# Patient Record
Sex: Male | Born: 1980 | Race: White | Hispanic: No | Smoking: Current every day smoker
Health system: Southern US, Community
[De-identification: ages and names within clinical notes are randomized; demographics above are authoritative.]

## PROBLEM LIST (undated history)

## (undated) DIAGNOSIS — F1010 Alcohol abuse, uncomplicated: Secondary | ICD-10-CM

## (undated) DIAGNOSIS — J45909 Unspecified asthma, uncomplicated: Secondary | ICD-10-CM

## (undated) DIAGNOSIS — M109 Gout, unspecified: Secondary | ICD-10-CM

## (undated) DIAGNOSIS — K589 Irritable bowel syndrome without diarrhea: Secondary | ICD-10-CM

## (undated) DIAGNOSIS — R251 Tremor, unspecified: Secondary | ICD-10-CM

---

## 2004-05-27 ENCOUNTER — Emergency Department: Payer: Self-pay | Admitting: Emergency Medicine

## 2004-10-16 ENCOUNTER — Ambulatory Visit: Payer: Self-pay | Admitting: Family Medicine

## 2004-11-05 ENCOUNTER — Ambulatory Visit: Payer: Self-pay | Admitting: Internal Medicine

## 2010-09-17 ENCOUNTER — Ambulatory Visit: Payer: Self-pay | Admitting: Family Medicine

## 2011-08-11 ENCOUNTER — Ambulatory Visit: Payer: Self-pay | Admitting: Family Medicine

## 2012-07-28 ENCOUNTER — Emergency Department: Payer: Self-pay | Admitting: Emergency Medicine

## 2012-07-28 LAB — COMPREHENSIVE METABOLIC PANEL
Alkaline Phosphatase: 58 U/L (ref 50–136)
Calcium, Total: 8.6 mg/dL (ref 8.5–10.1)
Chloride: 106 mmol/L (ref 98–107)
EGFR (African American): >60
EGFR (Non-African Amer.): >60
Potassium: 4 mmol/L (ref 3.5–5.1)
SGPT (ALT): 56 U/L (ref 12–78)
Sodium: 139 mmol/L (ref 136–145)
Total Protein: 7.7 g/dL (ref 6.4–8.2)

## 2012-07-28 LAB — URINALYSIS, COMPLETE
Bacteria: NONE SEEN
Bilirubin,UR: NEGATIVE
Blood: NEGATIVE
Glucose,UR: NEGATIVE mg/dL (ref 0–75)
Leukocyte Esterase: NEGATIVE
Nitrite: NEGATIVE
Protein: 30
RBC,UR: 2 /HPF (ref 0–5)
Specific Gravity: 1.028 (ref 1.003–1.030)
WBC UR: 1 /HPF (ref 0–5)

## 2012-07-28 LAB — ETHANOL
Ethanol %: <0.003 % (ref 0.000–0.080)
Ethanol: <3 mg/dL

## 2012-07-28 LAB — CBC WITH DIFFERENTIAL/PLATELET
Eosinophil %: 0.3 %
HCT: 46 % (ref 40.0–52.0)
HGB: 16.2 g/dL (ref 13.0–18.0)
Lymphocyte #: 0.7 10*3/uL — ABNORMAL LOW (ref 1.0–3.6)
Lymphocyte %: 6.6 %
MCH: 31.3 pg (ref 26.0–34.0)
MCHC: 35.2 g/dL (ref 32.0–36.0)
Monocyte #: 0.8 x10 3/mm (ref 0.2–1.0)
Monocyte %: 7.4 %
Neutrophil #: 9.5 10*3/uL — ABNORMAL HIGH (ref 1.4–6.5)
Platelet: 157 10*3/uL (ref 150–440)
RBC: 5.18 10*6/uL (ref 4.40–5.90)

## 2012-07-28 LAB — LIPASE, BLOOD: Lipase: 98 U/L (ref 73–393)

## 2012-08-23 ENCOUNTER — Ambulatory Visit: Payer: Self-pay | Admitting: Neurology

## 2016-10-22 ENCOUNTER — Emergency Department: Payer: Self-pay

## 2016-10-22 ENCOUNTER — Encounter: Payer: Self-pay | Admitting: Medical Oncology

## 2016-10-22 ENCOUNTER — Emergency Department
Admission: EM | Admit: 2016-10-22 | Discharge: 2016-10-22 | Disposition: A | Discharge status: Home / Self Care | Payer: Self-pay | Attending: Emergency Medicine | Admitting: Emergency Medicine

## 2016-10-22 DIAGNOSIS — R079 Chest pain, unspecified: Secondary | ICD-10-CM | POA: Insufficient documentation

## 2016-10-22 DIAGNOSIS — S5002XA Contusion of left elbow, initial encounter: Secondary | ICD-10-CM | POA: Insufficient documentation

## 2016-10-22 DIAGNOSIS — W102XXA Fall (on)(from) incline, initial encounter: Secondary | ICD-10-CM | POA: Insufficient documentation

## 2016-10-22 DIAGNOSIS — G250 Essential tremor: Secondary | ICD-10-CM | POA: Insufficient documentation

## 2016-10-22 DIAGNOSIS — Y9301 Activity, walking, marching and hiking: Secondary | ICD-10-CM | POA: Insufficient documentation

## 2016-10-22 DIAGNOSIS — S5011XA Contusion of right forearm, initial encounter: Secondary | ICD-10-CM | POA: Insufficient documentation

## 2016-10-22 DIAGNOSIS — S0083XA Contusion of other part of head, initial encounter: Secondary | ICD-10-CM | POA: Insufficient documentation

## 2016-10-22 DIAGNOSIS — R4020 Unspecified coma: Secondary | ICD-10-CM

## 2016-10-22 DIAGNOSIS — W19XXXA Unspecified fall, initial encounter: Secondary | ICD-10-CM

## 2016-10-22 DIAGNOSIS — J45909 Unspecified asthma, uncomplicated: Secondary | ICD-10-CM | POA: Insufficient documentation

## 2016-10-22 DIAGNOSIS — S301XXA Contusion of abdominal wall, initial encounter: Secondary | ICD-10-CM | POA: Insufficient documentation

## 2016-10-22 DIAGNOSIS — Y999 Unspecified external cause status: Secondary | ICD-10-CM | POA: Insufficient documentation

## 2016-10-22 DIAGNOSIS — S161XXA Strain of muscle, fascia and tendon at neck level, initial encounter: Secondary | ICD-10-CM | POA: Insufficient documentation

## 2016-10-22 DIAGNOSIS — S8002XA Contusion of left knee, initial encounter: Secondary | ICD-10-CM | POA: Insufficient documentation

## 2016-10-22 DIAGNOSIS — S39012A Strain of muscle, fascia and tendon of lower back, initial encounter: Secondary | ICD-10-CM | POA: Insufficient documentation

## 2016-10-22 DIAGNOSIS — S20212A Contusion of left front wall of thorax, initial encounter: Secondary | ICD-10-CM | POA: Insufficient documentation

## 2016-10-22 DIAGNOSIS — Y929 Unspecified place or not applicable: Secondary | ICD-10-CM | POA: Insufficient documentation

## 2016-10-22 DIAGNOSIS — S8001XA Contusion of right knee, initial encounter: Secondary | ICD-10-CM | POA: Insufficient documentation

## 2016-10-22 DIAGNOSIS — R55 Syncope and collapse: Secondary | ICD-10-CM | POA: Insufficient documentation

## 2016-10-22 HISTORY — DX: Unspecified asthma, uncomplicated: J45.909

## 2016-10-22 LAB — CBC WITH DIFFERENTIAL/PLATELET
Basophils Absolute: 0.1 10*3/uL (ref 0–0.1)
Basophils Relative: 2 %
Eosinophils Absolute: 0.1 10*3/uL (ref 0–0.7)
Eosinophils Relative: 2 %
HEMATOCRIT: 43.3 % (ref 40.0–52.0)
HEMOGLOBIN: 15.2 g/dL (ref 13.0–18.0)
LYMPHS ABS: 2.1 10*3/uL (ref 1.0–3.6)
Lymphocytes Relative: 35 %
MCH: 35 pg — ABNORMAL HIGH (ref 26.0–34.0)
MCHC: 35.1 g/dL (ref 32.0–36.0)
MCV: 99.9 fL (ref 80.0–100.0)
MONOS PCT: 9 %
Monocytes Absolute: 0.6 10*3/uL (ref 0.2–1.0)
NEUTROS ABS: 3.1 10*3/uL (ref 1.4–6.5)
NEUTROS PCT: 52 %
Platelets: 102 10*3/uL — ABNORMAL LOW (ref 150–440)
RBC: 4.34 MIL/uL — ABNORMAL LOW (ref 4.40–5.90)
RDW: 15.1 % — ABNORMAL HIGH (ref 11.5–14.5)
WBC: 6 10*3/uL (ref 3.8–10.6)

## 2016-10-22 LAB — COMPREHENSIVE METABOLIC PANEL
ALT: 33 U/L (ref 17–63)
AST: 96 U/L — ABNORMAL HIGH (ref 15–41)
Albumin: 4.8 g/dL (ref 3.5–5.0)
Alkaline Phosphatase: 45 U/L (ref 38–126)
Anion gap: 18 — ABNORMAL HIGH (ref 5–15)
BUN: 7 mg/dL (ref 6–20)
CHLORIDE: 101 mmol/L (ref 101–111)
CO2: 23 mmol/L (ref 22–32)
CREATININE: 0.7 mg/dL (ref 0.61–1.24)
Calcium: 8.8 mg/dL — ABNORMAL LOW (ref 8.9–10.3)
GFR calc non Af Amer: >60 mL/min (ref >60)
Glucose, Bld: 94 mg/dL (ref 65–99)
Potassium: 3.1 mmol/L — ABNORMAL LOW (ref 3.5–5.1)
SODIUM: 142 mmol/L (ref 135–145)
Total Bilirubin: 1 mg/dL (ref 0.3–1.2)
Total Protein: 7.7 g/dL (ref 6.5–8.1)

## 2016-10-22 MED ORDER — KETOROLAC TROMETHAMINE 30 MG/ML IJ SOLN
30.0000 mg | Freq: Once | INTRAMUSCULAR | Status: AC
  Administered 2016-10-22: 30 mg via INTRAVENOUS
  Filled 2016-10-22: qty 1

## 2016-10-22 MED ORDER — IOPAMIDOL (ISOVUE-300) INJECTION 61%
100.0000 mL | Freq: Once | INTRAVENOUS | Status: AC | PRN
  Administered 2016-10-22: 100 mL via INTRAVENOUS
  Filled 2016-10-22: qty 100

## 2016-10-22 MED ORDER — ORPHENADRINE CITRATE 30 MG/ML IJ SOLN
60.0000 mg | Freq: Once | INTRAMUSCULAR | Status: AC
  Administered 2016-10-22: 60 mg via INTRAMUSCULAR
  Filled 2016-10-22: qty 2

## 2016-10-22 MED ORDER — MELOXICAM 15 MG PO TABS: 15.0000 mg | ORAL_TABLET | Freq: Every day | ORAL | 0 refills | Status: DC

## 2016-10-22 MED ORDER — METHOCARBAMOL 500 MG PO TABS: 500.0000 mg | ORAL_TABLET | Freq: Four times a day (QID) | ORAL | 0 refills | Status: DC

## 2016-10-22 MED ORDER — SODIUM CHLORIDE 0.9 % IV BOLUS (SEPSIS)
1000.0000 mL | Freq: Once | INTRAVENOUS | Status: AC
  Administered 2016-10-22: 1000 mL via INTRAVENOUS

## 2016-10-22 NOTE — ED Notes (Signed)
c-collar applied  

## 2016-10-22 NOTE — ED Provider Notes (Signed)
[facility] Emergency Department Provider Note  ____________________________________________  Time seen: Approximately 5:46 PM  I have reviewed the triage vital signs and the nursing notes.   HISTORY  Chief Complaint Fall    HPI Paul Miles is a 36 y.o. male who presents to emergency department complaining of a fall down 11 stairs. He did strike his head and did lose consciousness. He is complaining of a headache, neck pain, chest pain, left-sided abdominal pain, left elbow pain, right forearm pain, bilateral knee pain. This occurred on the night before his presentation to the emergency department.Patient reports that he has essential tremors with radicular symptoms in his lower extremities. Patient reports however that he was trying to avoid his young son who is standing on the stairwell. Patient "missed a step" causing him to fall. Patient is unsure length of loss consciousness. Patient initially did not present as "it did not hurt that much." Patient reports that the pain has been increasing "all over." Patient has not had further lost consciousness. He denies any blurred vision, radicular symptoms and upper shortness. Patient does report headache, neck and back pain, chest pain with shortness of breath. Patient is endorsing abrasions to bilateral aspects of his lower abdominal wall with pain to the left side. Patient denies any hematuria or dysuria. He denies any melena.    Past Medical History:  Diagnosis Date  . Asthma     There are no active problems to display for this patient.   No past surgical history on file.  Prior to Admission medications   Medication Sig Start Date End Date Taking? Authorizing Provider  meloxicam (MOBIC) 15 MG tablet Take 1 tablet (15 mg total) by mouth daily. 10/22/16   Wynton Hufstetler, Delorise Royals, PA-C  methocarbamol (ROBAXIN) 500 MG tablet Take 1 tablet (500 mg total) by mouth 4 (four) times daily. 10/22/16   Layna Roeper, Delorise Royals,  PA-C    Allergies Patient has no known allergies.  No family history on file.  Social History Social History  Substance Use Topics  . Smoking status: Not on file  . Smokeless tobacco: Not on file  . Alcohol use Not on file     Review of Systems  Constitutional: No fever/chills Eyes: No visual changes. No discharge ENT: No upper respiratory complaints. Cardiovascular: no chest pain. Respiratory: no cough. Positive SOB. Gastrointestinal: No abdominal pain.  No nausea, no vomiting.  No diarrhea.  No constipation. Genitourinary: Negative for dysuria. No hematuria Musculoskeletal: Positive for neck pain, left-sided chest pain, lower back pain, right forearm pain, left elbow pain, bilateral knee pain. Skin: Negative for rash, abrasions, lacerations. Positive for multiple areas of contusions to the torso, extremities Neurological: Positive for headache but denies focal weakness or numbness. Positive for essential tremor 10-point ROS otherwise negative.  ____________________________________________   PHYSICAL EXAM:  VITAL SIGNS: ED Triage Vitals  Enc Vitals Group     BP 10/22/16 1723 (!) 133/98     Pulse Rate 10/22/16 1723 96     Resp 10/22/16 1723 17     Temp 10/22/16 1723 (!) 97.5 F (36.4 C)     Temp Source 10/22/16 1723 Oral     SpO2 10/22/16 1723 97 %     Weight 10/22/16 1716 190 lb (86.2 kg)     Height 10/22/16 1716 6' (1.829 m)     Head Circumference --      Peak Flow --      Pain Score 10/22/16 1716 8  Pain Loc --      Pain Edu? --      Excl. in GC? --      Constitutional: Alert and oriented. Well appearing and in no acute distress. Eyes: Conjunctivae are normal. PERRL. EOMI. Head: With mild ecchymosis noted to left-sided zygomatic arch. Patient is nontender to palpation of the osseous structures of the skull but is tender to palpation of the bilateral zygomatic arches. No palpable abnormality. No battle signs. No raccoon eyes. No serosanguineous fluid  drainage from the ears or nares. ENT:      Ears:       Nose: No congestion/rhinnorhea.      Mouth/Throat: Mucous membranes are moist.  Neck: No stridor.  Positive diffuse midline cervical spine tenderness to palpation. Patient is also tender to palpation over bilateral paraspinal muscle groups. Radial pulse intact bilateral upper extremity's. Sensation intact and equal bilateral upper extremities.  Cardiovascular: Normal rate, regular rhythm. Normal S1 and S2.  Good peripheral circulation. Respiratory: Normal respiratory effort without tachypnea or retractions. Lungs CTAB. Good air entry to the bases with no decreased or absent breath sounds. Gastrointestinal: Bowel sounds 4 quadrants. Abdomen soft palpation. Patient is tender to palpation left upper and left lower quadrants.. Patient is guarding left upper and left lower abdominal quadrants. No rigidity. No palpable masses. No distention. No CVA tenderness. Musculoskeletal: Patient has ecchymosis to the right forearm. Full range of motion to the elbow and right wrist. Patient is tender to palpation mid shaft. No palpable abnormality. Radial pulses intact) shimmy. Sensation intact all 5 digits right upper extremity. Patient has ecchymosis to the lateral and posterior left elbow. Full range of motion to the elbow. Patient is diffusely tender to the posterior and lateral aspect of the elbow. No palpable abnormality. Radial pulse and sensation intact distally. Ecchymosis is noted to bilateral tibial plateaus. No visible deformity or gross edema to the right knee. Full range of motion to the right knee. There is, valgus, Lachman's, McMurray's is negative. Patient is tender to palpation over the tibial plateau. No palpable abnormality. Dorsalis pedis pulse intact distally. Sensation intact distally. No visible deformity or gross edema to the left knee. The patient's tender to palpation of the tibial plateau. There is, valgus, Lachman's, McMurray's is negative.  Dorsalis pedis pulse intact distally. Sensation intact distally.. Neurologic:  Normal speech and language. No gross focal neurologic deficits are appreciated. Cranial nerves II through XII grossly intact. Skin:  Skin is warm, dry and intact. No rash noted. Psychiatric: Mood and affect are normal. Speech and behavior are normal. Patient exhibits appropriate insight and judgement.   ____________________________________________   LABS (all labs ordered are listed, but only abnormal results are displayed)  Labs Reviewed  COMPREHENSIVE METABOLIC PANEL - Abnormal; Notable for the following:       Result Value   Potassium 3.1 (*)    Calcium 8.8 (*)    AST 96 (*)    Anion gap 18 (*)    All other components within normal limits  CBC WITH DIFFERENTIAL/PLATELET - Abnormal; Notable for the following:    RBC 4.34 (*)    MCH 35.0 (*)    RDW 15.1 (*)    Platelets 102 (*)    All other components within normal limits   ____________________________________________  EKG  EKG reveals normal sinus rhythm at a rate of 88 bpm. PR and QRS interval within normal limits. Slightly prolonged QT interval. No ST elevations or depressions noted. No Q waves or delta waves identified.  Normal axis. ____________________________________________  RADIOLOGY Festus BarrenI, Pershing Skidmore D Markiyah Gahm, personally viewed and evaluated these images (plain radiographs) as part of my medical decision making, as well as reviewing the written report by the radiologist.  Dg Elbow Complete Left  Result Date: 10/22/2016 CLINICAL DATA:  Fall down stairs today. Diffuse elbow pain. Initial encounter. EXAM: LEFT ELBOW - COMPLETE 3+ VIEW COMPARISON:  None. FINDINGS: There is no evidence of fracture, dislocation, or joint effusion. There is no evidence of arthropathy or other focal bone abnormality. Soft tissues are unremarkable. IMPRESSION: Negative. Electronically Signed   By: Marnee SpringJonathon  Watts M.D.   On: 10/22/2016 19:03   Dg Forearm Right  Result  Date: 10/22/2016 CLINICAL DATA:  Pain following fall EXAM: RIGHT FOREARM - 2 VIEW COMPARISON:  None. FINDINGS: Frontal and lateral views were obtained. No fracture or dislocation. Joint spaces appear normal. No erosive change. IMPRESSION: No fracture or dislocation.  No evident arthropathy. Electronically Signed   By: Bretta BangWilliam  Woodruff III M.D.   On: 10/22/2016 19:03   Ct Head Wo Contrast  Result Date: 10/22/2016 CLINICAL DATA:  36 year old male with acute headache and neck pain following fall yesterday. Loss of consciousness. EXAM: CT HEAD WITHOUT CONTRAST CT CERVICAL SPINE WITHOUT CONTRAST TECHNIQUE: Multidetector CT imaging of the head and cervical spine was performed following the standard protocol without intravenous contrast. Multiplanar CT image reconstructions of the cervical spine were also generated. COMPARISON:  08/23/2012 brain MR FINDINGS: CT HEAD FINDINGS Brain: No evidence of acute infarction, hemorrhage, hydrocephalus, extra-axial collection or mass lesion/mass effect. Mild scattered white matter hypodensities are unchanged. Vascular: No hyperdense vessel or unexpected calcification. Skull: Normal. Negative for fracture or focal lesion. Sinuses/Orbits: No acute finding. Other: None. CT CERVICAL SPINE FINDINGS Alignment: Normal. Skull base and vertebrae: No acute fracture. No primary bone lesion or focal pathologic process. Soft tissues and spinal canal: No prevertebral fluid or swelling. No visible canal hematoma. Disc levels:  Unremarkable Upper chest: Negative. Other: None IMPRESSION: No evidence of acute intracranial abnormality. No static evidence of acute injury to the cervical spine. Electronically Signed   By: Harmon PierJeffrey  Hu M.D.   On: 10/22/2016 18:47   Ct Chest W Contrast  Result Date: 10/22/2016 CLINICAL DATA:  Fall down 11 stairs. Chest pain with shortness of breath. Left-sided abdominal pain. Bruising. EXAM: CT CHEST, ABDOMEN, AND PELVIS WITH CONTRAST TECHNIQUE: Multidetector CT imaging  of the chest, abdomen and pelvis was performed following the standard protocol during bolus administration of intravenous contrast. CONTRAST:  100mL ISOVUE-300 IOPAMIDOL (ISOVUE-300) INJECTION 61% COMPARISON:  Abdominal CT 07/28/2012 FINDINGS: CT CHEST FINDINGS Cardiovascular: No acute aortic injury. The heart is normal in size. No pericardial fluid. Mediastinum/Nodes: No mediastinal hematoma or fluid. No mediastinal or hilar adenopathy. Visualized thyroid gland is normal. No pneumomediastinum. The esophagus is decompressed. Lungs/Pleura: No pneumothorax or pulmonary contusion. Central bronchial thickening. Linear atelectasis in the right lower lobe. Minimal mucous in the left mainstem bronchus, with scattered adherent mucus in the upper trachea. No pleural fluid. Musculoskeletal: No fracture of the sternum, ribs, included clavicles and shoulder girdles or thoracic spine. No confluent body wall contusion. Mild scoliotic curvature of the midthoracic spine. CT ABDOMEN PELVIS FINDINGS Hepatobiliary: No hepatic injury or perihepatic hematoma. Severely decreased hepatic density consistent with steatosis, with more focal fatty infiltration adjacent with falciform ligament. Gallbladder is unremarkable Pancreas: No ductal dilatation or inflammation. Spleen: No splenic injury or perisplenic hematoma. Adrenals/Urinary Tract: No adrenal hemorrhage or renal injury identified. Homogeneous renal enhancement with symmetric excretion on delayed phase  imaging. Bladder is unremarkable. Stomach/Bowel: No evidence of bowel injury. Stomach is decompressed. No bowel wall thickening, obstruction or inflammation. Normal appendix. No mesenteric hematoma. Scattered colonic diverticula throughout the entire colon. Vascular/Lymphatic: No acute vascular injury. The abdominal aorta and IVC are intact. No retroperitoneal fluid. No abdominal or pelvic adenopathy. Reproductive: Prostate is unremarkable. Other: No free air, free fluid, or  intra-abdominal fluid collection. Tiny fat containing umbilical hernia. Musculoskeletal: No fracture of the lumbar spine or bony pelvis. No confluent body wall contusion. IMPRESSION: 1. No acute traumatic injury to the chest, abdomen, or pelvis. 2. Incidental findings in the abdomen include severe hepatic steatosis and colonic diverticulosis. 3. Incidental findings in the chest include bronchial thickening which may be due to history of asthma. Electronically Signed   By: Rubye Oaks M.D.   On: 10/22/2016 18:49   Ct Cervical Spine Wo Contrast  Result Date: 10/22/2016 CLINICAL DATA:  36 year old male with acute headache and neck pain following fall yesterday. Loss of consciousness. EXAM: CT HEAD WITHOUT CONTRAST CT CERVICAL SPINE WITHOUT CONTRAST TECHNIQUE: Multidetector CT imaging of the head and cervical spine was performed following the standard protocol without intravenous contrast. Multiplanar CT image reconstructions of the cervical spine were also generated. COMPARISON:  08/23/2012 brain MR FINDINGS: CT HEAD FINDINGS Brain: No evidence of acute infarction, hemorrhage, hydrocephalus, extra-axial collection or mass lesion/mass effect. Mild scattered white matter hypodensities are unchanged. Vascular: No hyperdense vessel or unexpected calcification. Skull: Normal. Negative for fracture or focal lesion. Sinuses/Orbits: No acute finding. Other: None. CT CERVICAL SPINE FINDINGS Alignment: Normal. Skull base and vertebrae: No acute fracture. No primary bone lesion or focal pathologic process. Soft tissues and spinal canal: No prevertebral fluid or swelling. No visible canal hematoma. Disc levels:  Unremarkable Upper chest: Negative. Other: None IMPRESSION: No evidence of acute intracranial abnormality. No static evidence of acute injury to the cervical spine. Electronically Signed   By: Harmon Pier M.D.   On: 10/22/2016 18:47   Ct Abdomen Pelvis W Contrast  Result Date: 10/22/2016 CLINICAL DATA:  Fall  down 11 stairs. Chest pain with shortness of breath. Left-sided abdominal pain. Bruising. EXAM: CT CHEST, ABDOMEN, AND PELVIS WITH CONTRAST TECHNIQUE: Multidetector CT imaging of the chest, abdomen and pelvis was performed following the standard protocol during bolus administration of intravenous contrast. CONTRAST:  ISOVUE-300 IOPAMIDOL (ISOVUE-300) INJECTION 61% COMPARISON:  Abdominal CT 07/28/2012 FINDINGS: CT CHEST FINDINGS Cardiovascular: No acute aortic injury. The heart is normal in size. No pericardial fluid. Mediastinum/Nodes: No mediastinal hematoma or fluid. No mediastinal or hilar adenopathy. Visualized thyroid gland is normal. No pneumomediastinum. The esophagus is decompressed. Lungs/Pleura: No pneumothorax or pulmonary contusion. Central bronchial thickening. Linear atelectasis in the right lower lobe. Minimal mucous in the left mainstem bronchus, with scattered adherent mucus in the upper trachea. No pleural fluid. Musculoskeletal: No fracture of the sternum, ribs, included clavicles and shoulder girdles or thoracic spine. No confluent body wall contusion. Mild scoliotic curvature of the midthoracic spine. CT ABDOMEN PELVIS FINDINGS Hepatobiliary: No hepatic injury or perihepatic hematoma. Severely decreased hepatic density consistent with steatosis, with more focal fatty infiltration adjacent with falciform ligament. Gallbladder is unremarkable Pancreas: No ductal dilatation or inflammation. Spleen: No splenic injury or perisplenic hematoma. Adrenals/Urinary Tract: No adrenal hemorrhage or renal injury identified. Homogeneous renal enhancement with symmetric excretion on delayed phase imaging. Bladder is unremarkable. Stomach/Bowel: No evidence of bowel injury. Stomach is decompressed. No bowel wall thickening, obstruction or inflammation. Normal appendix. No mesenteric hematoma. Scattered colonic diverticula throughout  the entire colon. Vascular/Lymphatic: No acute vascular injury. The  abdominal aorta and IVC are intact. No retroperitoneal fluid. No abdominal or pelvic adenopathy. Reproductive: Prostate is unremarkable. Other: No free air, free fluid, or intra-abdominal fluid collection. Tiny fat containing umbilical hernia. Musculoskeletal: No fracture of the lumbar spine or bony pelvis. No confluent body wall contusion. IMPRESSION: 1. No acute traumatic injury to the chest, abdomen, or pelvis. 2. Incidental findings in the abdomen include severe hepatic steatosis and colonic diverticulosis. 3. Incidental findings in the chest include bronchial thickening which may be due to history of asthma. Electronically Signed   By: Rubye Oaks M.D.   On: 10/22/2016 18:49   Dg Knee Complete 4 Views Left  Result Date: 10/22/2016 CLINICAL DATA:  Pain following fall EXAM: LEFT KNEE - COMPLETE 4+ VIEW COMPARISON:  None. FINDINGS: Frontal, lateral, and bilateral oblique views were obtained. No fracture or dislocation. No joint effusion. Joint spaces appear normal. No erosive change. IMPRESSION: No fracture or joint effusion.  No appreciable arthropathy. Electronically Signed   By: Bretta Bang III M.D.   On: 10/22/2016 19:04   Dg Knee Complete 4 Views Right  Result Date: 10/22/2016 CLINICAL DATA:  Pain following fall EXAM: RIGHT KNEE - COMPLETE 4+ VIEW COMPARISON:  None. FINDINGS: Frontal, lateral, and bilateral oblique views were obtained. No fracture or dislocation. No joint effusion. Joint spaces appear normal. No erosive change. IMPRESSION: Fracture or joint effusion.  No evident arthropathy. Electronically Signed   By: Bretta Bang III M.D.   On: 10/22/2016 19:05    ____________________________________________    PROCEDURES  Procedure(s) performed:    Procedures    Medications  ketorolac (TORADOL) 30 MG/ML injection 30 mg (not administered)  orphenadrine (NORFLEX) injection 60 mg (not administered)  sodium chloride 0.9 % bolus 1,000 mL (0 mLs Intravenous Stopped 10/22/16  1949)  iopamidol (ISOVUE-300) 61 % injection 100 mL (100 mLs Intravenous Contrast Given 10/22/16 1815)     ____________________________________________   INITIAL IMPRESSION / ASSESSMENT AND PLAN / ED COURSE  Pertinent labs & imaging results that were available during my care of the patient were reviewed by me and considered in my medical decision making (see chart for details).  Review of the [place] CSRS was performed in accordance of the [facility] prior to dispensing any controlled drugs.     Patient's diagnosis is consistent with A fall resulting in loss of consciousness, contusion of the face, strain of neck muscle and lumbar region, contusion of the left chest wall, contusion of the left abdominal wall, forearm contusion, elbow contusion, bilateral knee contusions. Patient suffered a fall down 11 steps with loss of consciousness. This occurred yesterday but patient reports that symptoms were worsening today. No acute consciousness. Patient was evaluated with CT scans of the head, neck, chest abdomen and pelvis. These returned with reassuring results with no acute abnormalities. patient's x-rays of the right forearm, left elbow, bilateral knees reveal no acute osseous abnormality. Labs are reassuring. No significant EKG changes. Exam was grossly reassuring. With reassuring radiological results, patient will be given symptom control medications and discharged home.. Patient will be discharged home with prescriptions for anti-inflammatories and muscle relaxer. Patient is to follow up with primary care as needed or otherwise directed. Patient is given ED precautions to return to the ED for any worsening or new symptoms.     ____________________________________________  FINAL CLINICAL IMPRESSION(S) / ED DIAGNOSES  Final diagnoses:  Fall, initial encounter  Contusion of face, initial encounter  Strain of  neck muscle, initial encounter  Strain of lumbar region, initial encounter  Contusion of left  chest wall, initial encounter  Contusion of abdominal wall, initial encounter  Contusion of left elbow, initial encounter  Contusion of right forearm, initial encounter  Contusion of left knee, initial encounter  Contusion of right knee, initial encounter  Essential tremor  Loss of consciousness (HCC)      NEW MEDICATIONS STARTED DURING THIS VISIT:  New Prescriptions   MELOXICAM (MOBIC) 15 MG TABLET    Take 1 tablet (15 mg total) by mouth daily.   METHOCARBAMOL (ROBAXIN) 500 MG TABLET    Take 1 tablet (500 mg total) by mouth 4 (four) times daily.        This chart was dictated using voice recognition software/Dragon. Despite best efforts to proofread, errors can occur which can change the meaning. Any change was purely unintentional.    Racheal Patches, PA-C 10/22/16 1954    Zyion Doxtater, Delorise Royals, PA-C 10/22/16 1958    Emily Filbert, MD 10/22/16 2127

## 2016-10-22 NOTE — ED Triage Notes (Signed)
Pt reports he missed a step yesterday and fell, pt c/o pain to rt side. Denies head injury.

## 2016-10-22 NOTE — ED Notes (Signed)
Pt reports yesterday fell down 11 stairs yesterday with positive LOC.  Tenderness to cervical spine on palpation.  Also c/o abdominal tenderness; most pain to LLQ on exam.  Bruising to abdomen as well as arms and legs.  Pt also c/o severe chest pain.

## 2016-10-22 NOTE — ED Notes (Signed)
At bedside to check vitals. Pt remains in radiology

## 2016-10-22 NOTE — ED Notes (Signed)
Patient transported to CT 

## 2017-03-04 ENCOUNTER — Emergency Department: Payer: Self-pay

## 2017-03-04 ENCOUNTER — Emergency Department
Admission: EM | Admit: 2017-03-04 | Discharge: 2017-03-04 | Disposition: A | Discharge status: Home / Self Care | Payer: Self-pay | Attending: Emergency Medicine | Admitting: Emergency Medicine

## 2017-03-04 DIAGNOSIS — Z791 Long term (current) use of non-steroidal anti-inflammatories (NSAID): Secondary | ICD-10-CM | POA: Insufficient documentation

## 2017-03-04 DIAGNOSIS — J209 Acute bronchitis, unspecified: Secondary | ICD-10-CM | POA: Insufficient documentation

## 2017-03-04 DIAGNOSIS — F17200 Nicotine dependence, unspecified, uncomplicated: Secondary | ICD-10-CM | POA: Insufficient documentation

## 2017-03-04 HISTORY — DX: Gout, unspecified: M10.9

## 2017-03-04 HISTORY — DX: Tremor, unspecified: R25.1

## 2017-03-04 HISTORY — DX: Irritable bowel syndrome, unspecified: K58.9

## 2017-03-04 MED ORDER — AZITHROMYCIN 250 MG PO TABS: ORAL_TABLET | ORAL | 0 refills | Status: AC

## 2017-03-04 MED ORDER — PREDNISONE 50 MG PO TABS: ORAL_TABLET | ORAL | 0 refills | Status: DC

## 2017-03-04 NOTE — ED Notes (Signed)
Patient transported to X-ray 

## 2017-03-04 NOTE — ED Triage Notes (Signed)
Patient c/o productive cough, fever X 3-4 days. Patient's significant other reports patient was wheezing yesterday. Patient denies any current SOB. Patient had temperature of 104 at home. Patient took ibuprofen at approx 2 hours ago. Patient afebrile in triage.

## 2017-03-04 NOTE — ED Provider Notes (Signed)
[facility] Emergency Department Provider Note  ____________________________________________  Time seen: Approximately 9:14 PM  I have reviewed the triage vital signs and the nursing notes.   HISTORY  Chief Complaint Cough and Fever    HPI Paul RossJohn B Chamberland is a 36 y.o. male presents to the emergency department with productive cough for green sputum production, shortness of breath and congestion for the past 5 days.  Fever has been as high as 103 F assessed orally.  Patient denies chest pain, chest tightness, nausea, vomiting abdominal pain.  Patient has a history of asthma and has noticed wheezing.  No alleviating measures have been attempted aside from albuterol inhaler.   Past Medical History:  Diagnosis Date  . Asthma   . Gout   . IBS (irritable bowel syndrome)   . Tremor     There are no active problems to display for this patient.   History reviewed. No pertinent surgical history.  Prior to Admission medications   Medication Sig Start Date End Date Taking? Authorizing Provider  azithromycin (ZITHROMAX Z-PAK) 250 MG tablet Take 2 tablets (500 mg) on  Day 1,  followed by 1 tablet (250 mg) once daily on Days 2 through 5. 03/04/17 03/09/17  Orvil FeilWoods, Blythe Veach M, PA-C  meloxicam (MOBIC) 15 MG tablet Take 1 tablet (15 mg total) by mouth daily. 10/22/16   Cuthriell, Delorise RoyalsJonathan D, PA-C  methocarbamol (ROBAXIN) 500 MG tablet Take 1 tablet (500 mg total) by mouth 4 (four) times daily. 10/22/16   Cuthriell, Delorise RoyalsJonathan D, PA-C  predniSONE (DELTASONE) 50 MG tablet Take one 50 mg tablet once a day for five days. 03/04/17   Orvil FeilWoods, Charlisha Market M, PA-C    Allergies Patient has no known allergies.  No family history on file.  Social History Social History   Tobacco Use  . Smoking status: Current Every Day Smoker  . Smokeless tobacco: Never Used  Substance Use Topics  . Alcohol use: Yes  . Drug use: No     Review of Systems  Constitutional: Patient has had  fever. Eyes: No visual changes. No discharge ENT: Patient has had congestion. Cardiovascular: no chest pain. Respiratory: Patient has cough, shortness of breath and wheezing. Gastrointestinal: No abdominal pain.  No nausea, no vomiting.  No diarrhea.  No constipation. Musculoskeletal: Negative for musculoskeletal pain. Skin: Negative for rash, abrasions, lacerations, ecchymosis. Neurological: Negative for headaches, focal weakness or numbness.  ____________________________________________   PHYSICAL EXAM:  VITAL SIGNS: ED Triage Vitals  Enc Vitals Group     BP 03/04/17 2001 128/81     Pulse Rate 03/04/17 2001 87     Resp 03/04/17 2001 17     Temp 03/04/17 2001 97.7 F (36.5 C)     Temp Source 03/04/17 2001 Oral     SpO2 03/04/17 2001 98 %     Weight 03/04/17 2002 190 lb (86.2 kg)     Height --      Head Circumference --      Peak Flow --      Pain Score 03/04/17 2001 4     Pain Loc --      Pain Edu? --      Excl. in GC? --      Constitutional: Alert and oriented. Well appearing and in no acute distress. Eyes: Conjunctivae are normal. PERRL. EOMI. Head: Atraumatic. ENT:      Ears: TMs are injected bilaterally.      Nose: No congestion/rhinnorhea.      Mouth/Throat: Mucous membranes  are moist.  Posterior pharynx is mildly erythematous.  Uvula is midline. Hematological/Lymphatic/Immunilogical: No cervical lymphadenopathy. Cardiovascular: Normal rate, regular rhythm. Normal S1 and S2.  Good peripheral circulation. Respiratory: Normal respiratory effort without tachypnea or retractions. Lungs CTAB. Good air entry to the bases with no decreased or absent breath sounds. Musculoskeletal: Full range of motion to all extremities. No gross deformities appreciated. Neurologic:  Normal speech and language. No gross focal neurologic deficits are appreciated.  Skin:  Skin is warm, dry and intact. No rash noted. Psychiatric: Mood and affect are normal. Speech and behavior are normal.  Patient exhibits appropriate insight and judgement.   ____________________________________________   LABS (all labs ordered are listed, but only abnormal results are displayed)  Labs Reviewed - No data to display ____________________________________________  EKG   ____________________________________________  RADIOLOGY Geraldo PitterI, Sterling Ucci M Livy Miles, personally viewed and evaluated these images (plain radiographs) as part of my medical decision making, as well as reviewing the written report by the radiologist.  Dg Chest 2 View  Result Date: 03/04/2017 CLINICAL DATA:  Cough and fever EXAM: CHEST  2 VIEW COMPARISON:  CT chest 10/22/2016 FINDINGS: Mild bronchitic changes. No consolidation or effusion. Normal heart size. No pneumothorax. IMPRESSION: Mild bronchitic changes.  No focal pulmonary infiltrate. Electronically Signed   By: Jasmine PangKim  Fujinaga M.D.   On: 03/04/2017 20:28    ____________________________________________    PROCEDURES  Procedure(s) performed:    Procedures    Medications - No data to display   ____________________________________________   INITIAL IMPRESSION / ASSESSMENT AND PLAN / ED COURSE  Pertinent labs & imaging results that were available during my care of the patient were reviewed by me and considered in my medical decision making (see chart for details).  Review of the [place] CSRS was performed in accordance of the [facility] prior to dispensing any controlled drugs.     Assessment and plan Acute bronchitis Differential diagnosis includes community-acquired pneumonia versus bronchitis versus asthma exacerbation.  Patient presents to the emergency department with productive cough, shortness of breath and perceived wheezing.  History and physical exam findings are consistent with acute bronchitis.  Chest x-ray reveals no consolidations or findings consistent with pneumonia.  Patient was discharged with azithromycin and prednisone.  Strict return precautions were  given to return to the emergency department for new or worsening symptoms.  Vital signs were reassuring prior to discharge.  All patient questions were answered.    ____________________________________________  FINAL CLINICAL IMPRESSION(S) / ED DIAGNOSES  Final diagnoses:  Acute bronchitis, unspecified organism      NEW MEDICATIONS STARTED DURING THIS VISIT:  ED Discharge Orders        Ordered    azithromycin (ZITHROMAX Z-PAK) 250 MG tablet     03/04/17 2112    predniSONE (DELTASONE) 50 MG tablet     03/04/17 2112          This chart was dictated using voice recognition software/Dragon. Despite best efforts to proofread, errors can occur which can change the meaning. Any change was purely unintentional.    Orvil FeilWoods, Jeanene Mena M, PA-C 03/04/17 2117    Arnaldo NatalMalinda, Paul F, MD 03/04/17 [phone]

## 2017-03-04 NOTE — ED Notes (Signed)
Pt states that he has had fever up to 104 at home and cough with dark green sputum since Tuesday. Pt also c/o chills and body aches. Pt in NAD at this time.

## 2017-04-06 ENCOUNTER — Emergency Department: Payer: Self-pay

## 2017-04-06 ENCOUNTER — Inpatient Hospital Stay
Admission: EM | Admit: 2017-04-06 | Discharge: 2017-04-08 | DRG: 439 | Disposition: A | Discharge status: Home / Self Care | Payer: Self-pay | Attending: Internal Medicine | Admitting: Internal Medicine

## 2017-04-06 ENCOUNTER — Encounter: Payer: Self-pay | Admitting: *Deleted

## 2017-04-06 ENCOUNTER — Other Ambulatory Visit: Payer: Self-pay

## 2017-04-06 ENCOUNTER — Inpatient Hospital Stay: Payer: Self-pay

## 2017-04-06 DIAGNOSIS — K219 Gastro-esophageal reflux disease without esophagitis: Secondary | ICD-10-CM | POA: Diagnosis present

## 2017-04-06 DIAGNOSIS — M109 Gout, unspecified: Secondary | ICD-10-CM | POA: Diagnosis present

## 2017-04-06 DIAGNOSIS — E8729 Other acidosis: Secondary | ICD-10-CM

## 2017-04-06 DIAGNOSIS — N289 Disorder of kidney and ureter, unspecified: Secondary | ICD-10-CM

## 2017-04-06 DIAGNOSIS — K529 Noninfective gastroenteritis and colitis, unspecified: Secondary | ICD-10-CM | POA: Diagnosis present

## 2017-04-06 DIAGNOSIS — E876 Hypokalemia: Secondary | ICD-10-CM | POA: Diagnosis present

## 2017-04-06 DIAGNOSIS — F1010 Alcohol abuse, uncomplicated: Secondary | ICD-10-CM | POA: Diagnosis present

## 2017-04-06 DIAGNOSIS — Z7141 Alcohol abuse counseling and surveillance of alcoholic: Secondary | ICD-10-CM

## 2017-04-06 DIAGNOSIS — F17200 Nicotine dependence, unspecified, uncomplicated: Secondary | ICD-10-CM | POA: Diagnosis present

## 2017-04-06 DIAGNOSIS — Z79899 Other long term (current) drug therapy: Secondary | ICD-10-CM

## 2017-04-06 DIAGNOSIS — E872 Acidosis: Secondary | ICD-10-CM | POA: Diagnosis present

## 2017-04-06 DIAGNOSIS — N179 Acute kidney failure, unspecified: Secondary | ICD-10-CM | POA: Diagnosis present

## 2017-04-06 DIAGNOSIS — K760 Fatty (change of) liver, not elsewhere classified: Secondary | ICD-10-CM | POA: Diagnosis present

## 2017-04-06 DIAGNOSIS — R112 Nausea with vomiting, unspecified: Secondary | ICD-10-CM

## 2017-04-06 DIAGNOSIS — K852 Alcohol induced acute pancreatitis without necrosis or infection: Principal | ICD-10-CM | POA: Diagnosis present

## 2017-04-06 LAB — COMPREHENSIVE METABOLIC PANEL
ALT: 86 U/L — ABNORMAL HIGH (ref 17–63)
AST: 155 U/L — ABNORMAL HIGH (ref 15–41)
Albumin: 5.7 g/dL — ABNORMAL HIGH (ref 3.5–5.0)
Alkaline Phosphatase: 75 U/L (ref 38–126)
Anion gap: 34 — ABNORMAL HIGH (ref 5–15)
BUN: 15 mg/dL (ref 6–20)
CHLORIDE: 88 mmol/L — AB (ref 101–111)
CO2: 7 mmol/L — ABNORMAL LOW (ref 22–32)
Calcium: 8.7 mg/dL — ABNORMAL LOW (ref 8.9–10.3)
Creatinine, Ser: 1.74 mg/dL — ABNORMAL HIGH (ref 0.61–1.24)
GFR calc non Af Amer: 49 mL/min — ABNORMAL LOW (ref >60)
GFR, EST AFRICAN AMERICAN: 57 mL/min — AB (ref >60)
Glucose, Bld: 179 mg/dL — ABNORMAL HIGH (ref 65–99)
POTASSIUM: 5.8 mmol/L — AB (ref 3.5–5.1)
Sodium: 129 mmol/L — ABNORMAL LOW (ref 135–145)
Total Bilirubin: 1.8 mg/dL — ABNORMAL HIGH (ref 0.3–1.2)
Total Protein: 9.2 g/dL — ABNORMAL HIGH (ref 6.5–8.1)

## 2017-04-06 LAB — CBC WITH DIFFERENTIAL/PLATELET
BASOS PCT: 0 %
Basophils Absolute: 0 10*3/uL (ref 0–0.1)
EOS ABS: 0 10*3/uL (ref 0–0.7)
EOS PCT: 0 %
HCT: 52 % (ref 40.0–52.0)
HEMOGLOBIN: 16.7 g/dL (ref 13.0–18.0)
LYMPHS ABS: 0.4 10*3/uL — AB (ref 1.0–3.6)
Lymphocytes Relative: 4 %
MCH: 33.4 pg (ref 26.0–34.0)
MCHC: 32.1 g/dL (ref 32.0–36.0)
MCV: 104 fL — ABNORMAL HIGH (ref 80.0–100.0)
MONOS PCT: 13 %
Monocytes Absolute: 1.5 10*3/uL — ABNORMAL HIGH (ref 0.2–1.0)
NEUTROS PCT: 83 %
Neutro Abs: 9.4 10*3/uL — ABNORMAL HIGH (ref 1.4–6.5)
PLATELETS: 169 10*3/uL (ref 150–440)
RBC: 5 MIL/uL (ref 4.40–5.90)
RDW: 14.6 % — AB (ref 11.5–14.5)
WBC: 11.4 10*3/uL — ABNORMAL HIGH (ref 3.8–10.6)

## 2017-04-06 LAB — INFLUENZA PANEL BY PCR (TYPE A & B)
Influenza A By PCR: NEGATIVE
Influenza B By PCR: NEGATIVE

## 2017-04-06 LAB — BASIC METABOLIC PANEL
ANION GAP: 9 (ref 5–15)
BUN: 14 mg/dL (ref 6–20)
CHLORIDE: 97 mmol/L — AB (ref 101–111)
CO2: 19 mmol/L — AB (ref 22–32)
Calcium: 7.3 mg/dL — ABNORMAL LOW (ref 8.9–10.3)
Creatinine, Ser: 1.11 mg/dL (ref 0.61–1.24)
GFR calc Af Amer: >60 mL/min (ref >60)
GLUCOSE: 165 mg/dL — AB (ref 65–99)
POTASSIUM: 3.7 mmol/L (ref 3.5–5.1)
Sodium: 125 mmol/L — ABNORMAL LOW (ref 135–145)

## 2017-04-06 LAB — PROTIME-INR
INR: 0.99
PROTHROMBIN TIME: 13 s (ref 11.4–15.2)

## 2017-04-06 LAB — APTT: aPTT: 28 seconds (ref 24–36)

## 2017-04-06 LAB — ETHANOL

## 2017-04-06 LAB — LIPASE, BLOOD: Lipase: 80 U/L — ABNORMAL HIGH (ref 11–51)

## 2017-04-06 MED ORDER — SODIUM CHLORIDE 0.9 % IV SOLN: INTRAVENOUS | Status: DC

## 2017-04-06 MED ORDER — DEXTROSE 5 % IV SOLN
INTRAVENOUS | Status: DC
  Administered 2017-04-06 – 2017-04-07 (×3): via INTRAVENOUS
  Filled 2017-04-06 (×3): qty 150

## 2017-04-06 MED ORDER — ONDANSETRON HCL 4 MG/2ML IJ SOLN
4.0000 mg | Freq: Once | INTRAMUSCULAR | Status: AC
  Administered 2017-04-06: 4 mg via INTRAVENOUS
  Filled 2017-04-06: qty 2

## 2017-04-06 MED ORDER — MORPHINE SULFATE (PF) 4 MG/ML IV SOLN
4.0000 mg | Freq: Once | INTRAVENOUS | Status: AC
  Administered 2017-04-06: 4 mg via INTRAVENOUS
  Filled 2017-04-06: qty 1

## 2017-04-06 MED ORDER — NICOTINE 21 MG/24HR TD PT24
21.0000 mg | MEDICATED_PATCH | Freq: Every day | TRANSDERMAL | Status: DC
  Administered 2017-04-06 – 2017-04-08 (×3): 21 mg via TRANSDERMAL
  Filled 2017-04-06 (×3): qty 1

## 2017-04-06 MED ORDER — IOPAMIDOL (ISOVUE-300) INJECTION 61%
100.0000 mL | Freq: Once | INTRAVENOUS | Status: AC | PRN
  Administered 2017-04-06: 100 mL via INTRAVENOUS

## 2017-04-06 MED ORDER — HYDROMORPHONE HCL 1 MG/ML IJ SOLN
2.0000 mg | INTRAMUSCULAR | Status: AC
  Administered 2017-04-06: 2 mg via INTRAVENOUS
  Filled 2017-04-06: qty 2

## 2017-04-06 MED ORDER — PANTOPRAZOLE SODIUM 40 MG IV SOLR
40.0000 mg | Freq: Once | INTRAVENOUS | Status: AC
  Administered 2017-04-06: 40 mg via INTRAVENOUS
  Filled 2017-04-06: qty 40

## 2017-04-06 MED ORDER — ONDANSETRON HCL 4 MG/2ML IJ SOLN
4.0000 mg | Freq: Four times a day (QID) | INTRAMUSCULAR | Status: DC
  Administered 2017-04-06 – 2017-04-07 (×5): 4 mg via INTRAVENOUS
  Filled 2017-04-06 (×5): qty 2

## 2017-04-06 MED ORDER — ALBUTEROL SULFATE (2.5 MG/3ML) 0.083% IN NEBU: 2.5000 mg | INHALATION_SOLUTION | Freq: Four times a day (QID) | RESPIRATORY_TRACT | Status: DC | PRN

## 2017-04-06 MED ORDER — PROMETHAZINE HCL 25 MG/ML IJ SOLN
25.0000 mg | Freq: Four times a day (QID) | INTRAMUSCULAR | Status: DC | PRN
  Administered 2017-04-07: 25 mg via INTRAVENOUS
  Filled 2017-04-06: qty 1

## 2017-04-06 MED ORDER — METOCLOPRAMIDE HCL 5 MG/ML IJ SOLN
10.0000 mg | Freq: Once | INTRAMUSCULAR | Status: AC
  Administered 2017-04-06: 10 mg via INTRAVENOUS
  Filled 2017-04-06: qty 2

## 2017-04-06 MED ORDER — SODIUM CHLORIDE 0.9 % IV BOLUS (SEPSIS)
2000.0000 mL | Freq: Once | INTRAVENOUS | Status: AC
  Administered 2017-04-06: 2000 mL via INTRAVENOUS

## 2017-04-06 MED ORDER — DEXTROSE 5 % AND 0.9 % NACL IV BOLUS
1000.0000 mL | Freq: Once | INTRAVENOUS | Status: DC
Start: 2017-04-06 — End: 2017-04-06

## 2017-04-06 MED ORDER — OXYCODONE HCL 5 MG PO TABS
5.0000 mg | ORAL_TABLET | ORAL | Status: DC | PRN
  Administered 2017-04-07: 5 mg via ORAL
  Filled 2017-04-06: qty 1

## 2017-04-06 MED ORDER — ACETAMINOPHEN 650 MG RE SUPP
650.0000 mg | Freq: Four times a day (QID) | RECTAL | Status: DC | PRN
Start: 2017-04-06 — End: 2017-04-08

## 2017-04-06 MED ORDER — IOPAMIDOL (ISOVUE-300) INJECTION 61%
15.0000 mL | INTRAVENOUS | Status: AC
  Administered 2017-04-06 (×2): 15 mL via ORAL

## 2017-04-06 MED ORDER — PANTOPRAZOLE SODIUM 40 MG IV SOLR
40.0000 mg | Freq: Two times a day (BID) | INTRAVENOUS | Status: DC
  Administered 2017-04-06 – 2017-04-07 (×3): 40 mg via INTRAVENOUS
  Filled 2017-04-06 (×4): qty 40

## 2017-04-06 MED ORDER — DEXTROSE-NACL 5-0.45 % IV SOLN
INTRAVENOUS | Status: DC
  Administered 2017-04-06: 15:00:00 via INTRAVENOUS

## 2017-04-06 MED ORDER — PROMETHAZINE HCL 25 MG/ML IJ SOLN
12.5000 mg | INTRAMUSCULAR | Status: AC
  Administered 2017-04-06: 12.5 mg via INTRAVENOUS
  Filled 2017-04-06: qty 1

## 2017-04-06 MED ORDER — MORPHINE SULFATE (PF) 2 MG/ML IV SOLN
2.0000 mg | INTRAVENOUS | Status: DC | PRN
  Administered 2017-04-07 (×2): 2 mg via INTRAVENOUS
  Filled 2017-04-06 (×2): qty 1

## 2017-04-06 MED ORDER — ENOXAPARIN SODIUM 40 MG/0.4ML ~~LOC~~ SOLN
40.0000 mg | SUBCUTANEOUS | Status: DC
  Filled 2017-04-06: qty 0.4

## 2017-04-06 MED ORDER — ACETAMINOPHEN 325 MG PO TABS: 650.0000 mg | ORAL_TABLET | Freq: Four times a day (QID) | ORAL | Status: DC | PRN

## 2017-04-06 NOTE — ED Triage Notes (Addendum)
Pt to ED reporting excessive vomiting  For the past days and reports "over 100 episodes"  abd pain and generalized aches. No diarrhea or fevers reported. PT is shaking in triage and reports he has been dizzy and lightheaded.   Pt has not been able to keep food or water down in two days. Pt reports second to last episode of vomiting was coffee ground in nature and last emesis was bright red blood.

## 2017-04-06 NOTE — ED Notes (Signed)
Pt states " I am seeing dots like fireworks, and when I walk outside I cant see anything"

## 2017-04-06 NOTE — H&P (Signed)
[facility] [place] [facility]   PATIENT NAME: Paul Miles    MR#:  [id number]  DATE OF BIRTH:  05-Dec-1980  DATE OF ADMISSION:  04/06/2017  PRIMARY CARE PHYSICIAN: Jeannie DonePolanco, Leonard F, MD   REQUESTING/REFERRING PHYSICIAN: Dr. Sharman CheekPhillip Stafford  CHIEF COMPLAINT:   Chief Complaint  Patient presents with  . Emesis    HISTORY OF PRESENT ILLNESS:  Paul ApaJohn Fuster  is a 36 y.o. male with a known history of asthma, IBS presents to hospital secondary to worsening abdominal pain, nausea and vomiting. Symptoms started about 2 days ago with myalgias, chills at home. Denies any diarrhea, actually constipated. No fevers here. Significant nausea associated with several episodes of vomiting and made him weak, fatigued and came to the hospital. Significantly acidotic here with a bicarbonate of 7, potassium of 5.8. Unable to keep anything on the stomach for the last 2 days. Denies any travel, [place] prior to this. Nobody else were sick. No other sick contacts.  PAST MEDICAL HISTORY:   Past Medical History:  Diagnosis Date  . Asthma   . Gout   . IBS (irritable bowel syndrome)   . Tremor     PAST SURGICAL HISTORY:  History reviewed. No pertinent surgical history.  SOCIAL HISTORY:   Social History   Tobacco Use  . Smoking status: Current Every Day Smoker    Packs/day: 1.00  . Smokeless tobacco: Never Used  Substance Use Topics  . Alcohol use: Yes    Comment: occasional    FAMILY HISTORY:   Family History  Problem Relation Age of Onset  . Diabetes Mother   . CAD Mother     DRUG ALLERGIES:  No Known Allergies  REVIEW OF SYSTEMS:   Review of Systems  Constitutional: Positive for chills and malaise/fatigue. Negative for fever and weight loss.  HENT: Negative for ear discharge, ear pain, hearing loss and nosebleeds.   Eyes: Negative for blurred vision, double vision and photophobia.  Respiratory: Negative for cough,  hemoptysis, shortness of breath and wheezing.   Cardiovascular: Negative for chest pain, palpitations, orthopnea and leg swelling.  Gastrointestinal: Positive for abdominal pain, constipation, nausea and vomiting. Negative for diarrhea, heartburn and melena.  Genitourinary: Negative for dysuria, frequency, hematuria and urgency.  Musculoskeletal: Positive for back pain and myalgias. Negative for neck pain.  Skin: Negative for rash.  Neurological: Negative for dizziness, tingling, sensory change, speech change, focal weakness and headaches.  Endo/Heme/Allergies: Does not bruise/bleed easily.  Psychiatric/Behavioral: Negative for depression.    MEDICATIONS AT HOME:   Prior to Admission medications   Medication Sig Start Date End Date Taking? Authorizing Provider  albuterol (PROVENTIL HFA;VENTOLIN HFA) 108 (90 Base) MCG/ACT inhaler Inhale 2 puffs into the lungs every 6 (six) hours as needed for wheezing or shortness of breath.   Yes [provider]  meloxicam (MOBIC) 15 MG tablet Take 1 tablet (15 mg total) by mouth daily. Patient not taking: Reported on 04/06/2017 10/22/16   Cuthriell, Delorise RoyalsJonathan D, PA-C  methocarbamol (ROBAXIN) 500 MG tablet Take 1 tablet (500 mg total) by mouth 4 (four) times daily. Patient not taking: Reported on 04/06/2017 10/22/16   Cuthriell, Delorise RoyalsJonathan D, PA-C  predniSONE (DELTASONE) 50 MG tablet Take one 50 mg tablet once a day for five days. Patient not taking: Reported on 04/06/2017 03/04/17   Orvil FeilWoods, Jaclyn M, PA-C      VITAL SIGNS:  Blood pressure (!) 151/106, pulse (!) 115, temperature 97.6 F (  36.4 C), temperature source Oral, resp. rate 16, height 6' (1.829 m), weight 81.6 kg (180 lb), SpO2 100 %.  PHYSICAL EXAMINATION:   Physical Exam  GENERAL:  36 y.o.-year-old patient lying in the bed, appears restless and uncomfortable.  EYES: Pupils equal, round, reactive to light and accommodation. No scleral icterus. Extraocular muscles intact.  HEENT: Head  atraumatic, normocephalic. Oropharynx and nasopharynx clear. Increased erythema of posterior pharyngeal wall. NECK:  Supple, no jugular venous distention. No thyroid enlargement, no tenderness.  LUNGS: Normal breath sounds bilaterally, no wheezing, rales,rhonchi or crepitation. No use of accessory muscles of respiration. Decreased bibasilar breath sounds CARDIOVASCULAR: S1, S2 normal. No murmurs, rubs, or gallops.  ABDOMEN: Diffuse abdominal pain all over the belly, soft, no guarding or rigidity,, nondistended. Bowel sounds present. No organomegaly or mass.  EXTREMITIES: No pedal edema, cyanosis, or clubbing.  NEUROLOGIC: Cranial nerves II through XII are intact. Muscle strength 5/5 in all extremities. Sensation intact. Gait not checked.  PSYCHIATRIC: The patient is alert and oriented x 3.  SKIN: No obvious rash, lesion, or ulcer.   LABORATORY PANEL:   CBC Recent Labs  Lab 04/06/17 1206  WBC 11.4*  HGB 16.7  HCT 52.0  PLT 169   ------------------------------------------------------------------------------------------------------------------  Chemistries  Recent Labs  Lab 04/06/17 1206  NA 129*  K 5.8*  CL 88*  CO2 7*  GLUCOSE 179*  BUN 15  CREATININE 1.74*  CALCIUM 8.7*  AST 155*  ALT 86*  ALKPHOS 75  BILITOT 1.8*   ------------------------------------------------------------------------------------------------------------------  Cardiac Enzymes No results for input(s): TROPONINI in the last 168 hours. ------------------------------------------------------------------------------------------------------------------  RADIOLOGY:  Dg Abdomen Acute W/chest  Result Date: 04/06/2017 CLINICAL DATA:  Vomiting with hemoptysis for 2 days. Limited oral intake. Weakness, erythema and shivering. EXAM: DG ABDOMEN ACUTE W/ 1V CHEST COMPARISON:  Chest radiographs 03/04/2017. CT of the chest, abdomen and pelvis 10/22/2016. FINDINGS: The heart size and mediastinal contours are normal.  The lungs are clear. There is no pleural effusion or pneumothorax. No acute osseous findings are identified. The bowel gas pattern is nonobstructive. There is limited abdominal bowel gas. No free intraperitoneal air or abnormal air-fluid levels. There are no suspicious abdominal calcifications. There is a mild thoracolumbar scoliosis which appears unchanged. IMPRESSION: 1. No acute cardiopulmonary or abdominal process. 2. Thoracolumbar scoliosis. Electronically Signed   By: Carey Bullocks M.D.   On: 04/06/2017 12:59    EKG:   Orders placed or performed during the hospital encounter of 04/06/17  . EKG 12-Lead  . EKG 12-Lead    IMPRESSION AND PLAN:   Bodey Frizell  is a 36 y.o. male with a known history of asthma, IBS presents to hospital secondary to worsening abdominal pain, nausea and vomiting  1. Abdominal pain with nausea and vomiting-KUB with no obstruction. -CT of the abdomen ordered -Started on fluids, and nausea medications and monitor next  2. Severe metabolic acidosis-with the low bicarbonate and elevated potassium -IV fluids, bicarbonate drip ordered. Recheck labs in 2 hours. -Monitor on telemetry  3. Acute renal failure-prerenal azotemia. Monitor with fluids. -Avoid nephrotoxins  4. Elevated LFTs and lipase-could be acute pancreatitis. -Again follow up on the CT abdomen to rule out any gallbladder issues. -Keep him nothing by mouth  5. DVT Prophylaxis-Lovenox     All the records are reviewed and case discussed with ED provider. Management plans discussed with the patient, family and they are in agreement.  CODE STATUS: Full Code  TOTAL TIME TAKING CARE OF THIS PATIENT: 50 minutes.  Elaysia Devargas M.D on 04/06/2017 at 4:00 PM  Between 7am to 6pm - Pager - [phone]  After 6pm go to [email or web address] - Social research officer, governmentpassword [email or web address]  [facility]  Office  [phone]  CC: Primary care physician; Jeannie DonePolanco, Leonard F, MD

## 2017-04-06 NOTE — ED Provider Notes (Signed)
[facility] Emergency Department Provider Note  ____________________________________________  Time seen: Approximately 3:31 PM  I have reviewed the triage vital signs and the nursing notes.   HISTORY  Chief Complaint Emesis    HPI Paul Miles is a 36 y.o. male who presents with vomiting for the past 2-3 days associated with upper abdominal pain and generalized body aches. Also has fatigue. First symptoms were the fatigue and body aches. Abdominal pain is left upper quadrant, nonradiating, moderate intensity, crampy, worse with food, no alleviating factors.  After many many episodes of vomiting, this morning he noticed a blood tinge to his emesis.     Past Medical History:  Diagnosis Date  . Asthma   . Gout   . IBS (irritable bowel syndrome)   . Tremor      There are no active problems to display for this patient.    History reviewed. No pertinent surgical history.   Prior to Admission medications   Medication Sig Start Date End Date Taking? Authorizing Provider  albuterol (PROVENTIL HFA;VENTOLIN HFA) 108 (90 Base) MCG/ACT inhaler Inhale 2 puffs into the lungs every 6 (six) hours as needed for wheezing or shortness of breath.   Yes [provider]  meloxicam (MOBIC) 15 MG tablet Take 1 tablet (15 mg total) by mouth daily. Patient not taking: Reported on 04/06/2017 10/22/16   Cuthriell, Delorise Royals, PA-C  methocarbamol (ROBAXIN) 500 MG tablet Take 1 tablet (500 mg total) by mouth 4 (four) times daily. Patient not taking: Reported on 04/06/2017 10/22/16   Cuthriell, Delorise Royals, PA-C  predniSONE (DELTASONE) 50 MG tablet Take one 50 mg tablet once a day for five days. Patient not taking: Reported on 04/06/2017 03/04/17   Orvil Feil, PA-C     Allergies Patient has no known allergies.   History reviewed. No pertinent family history.  Social History Social History   Tobacco Use  . Smoking status: Current Every Day Smoker  .  Smokeless tobacco: Never Used  Substance Use Topics  . Alcohol use: Yes  . Drug use: No    Review of Systems  Constitutional:   No fever positive chills.  ENT:   Positive sore throat. No rhinorrhea. Cardiovascular:   No chest pain or syncope. Respiratory:   No dyspnea or cough. Gastrointestinal:   positive upper abdominal pain as above, positive vomiting. No diarrhea or constipation.  Musculoskeletal:   Negative for focal pain or swelling All other systems reviewed and are negative except as documented above in ROS and HPI.  ____________________________________________   PHYSICAL EXAM:  VITAL SIGNS: ED Triage Vitals  Enc Vitals Group     BP 04/06/17 1138 (!) 155/91     Pulse Rate 04/06/17 1138 (!) 149     Resp 04/06/17 1138 (!) 24     Temp 04/06/17 1138 97.6 F (36.4 C)     Temp Source 04/06/17 1138 Oral     SpO2 04/06/17 1138 100 %     Weight 04/06/17 1139 180 lb (81.6 kg)     Height 04/06/17 1139 6' (1.829 m)     Head Circumference --      Peak Flow --      Pain Score 04/06/17 1147 10     Pain Loc --      Pain Edu? --      Excl. in GC? --     Vital signs reviewed, nursing assessments reviewed.   Constitutional:   Alert and oriented.ill-appearing, not in distress Eyes:  No scleral icterus.  EOMI. No nystagmus. No conjunctival pallor. PERRL. ENT   Head:   Normocephalic and atraumatic.   Nose:   No congestion/rhinnorhea.    Mouth/Throat:   dry mucous membranes, no pharyngeal erythema. No peritonsillar mass.    Neck:   No meningismus. Full ROM.no subcutaneous emphysema Hematological/Lymphatic/Immunilogical:   No cervical lymphadenopathy. Cardiovascular:   tachycardia heart rate 150. Symmetric bilateral radial and DP pulses.  No murmurs.  Respiratory:   tachypnea without accessory muscle use. Breath sounds are clear and equal bilaterally. No wheezes/rales/rhonchi. Gastrointestinal:   Soft with left upper quadrant tenderness. Non distended. There is no  CVA tenderness.  No rebound, rigidity, or guarding. Genitourinary:   deferred Musculoskeletal:   Normal range of motion in all extremities. No joint effusions.  No lower extremity tenderness.  No edema. Neurologic:   Normal speech and language.  Motor grossly intact. No gross focal neurologic deficits are appreciated.  Skin:    Skin is warm, dry and intact. No rash noted.  No petechiae, purpura, or bullae.  ____________________________________________    LABS (pertinent positives/negatives) (all labs ordered are listed, but only abnormal results are displayed) Labs Reviewed  COMPREHENSIVE METABOLIC PANEL - Abnormal; Notable for the following components:      Result Value   Sodium 129 (*)    Potassium 5.8 (*)    Chloride 88 (*)    CO2 7 (*)    Glucose, Bld 179 (*)    Creatinine, Ser 1.74 (*)    Calcium 8.7 (*)    Total Protein 9.2 (*)    Albumin 5.7 (*)    AST 155 (*)    ALT 86 (*)    Total Bilirubin 1.8 (*)    GFR calc non Af Amer 49 (*)    GFR calc Af Amer 57 (*)    Anion gap 34 (*)    All other components within normal limits  LIPASE, BLOOD - Abnormal; Notable for the following components:   Lipase 80 (*)    All other components within normal limits  CBC WITH DIFFERENTIAL/PLATELET - Abnormal; Notable for the following components:   WBC 11.4 (*)    MCV 104.0 (*)    RDW 14.6 (*)    Neutro Abs 9.4 (*)    Lymphs Abs 0.4 (*)    Monocytes Absolute 1.5 (*)    All other components within normal limits  ETHANOL  PROTIME-INR  APTT  INFLUENZA PANEL BY PCR (TYPE A & B)   ____________________________________________   EKG  Interpreted by me Sinus tachycardia rate 147, normal axis and intervals. Poor R-wave progression in anterior precordial leads. Normal ST segments and T waves.  ____________________________________________    RADIOLOGY  Dg Abdomen Acute W/chest  Result Date: 04/06/2017 CLINICAL DATA:  Vomiting with hemoptysis for 2 days. Limited oral intake.  Weakness, erythema and shivering. EXAM: DG ABDOMEN ACUTE W/ 1V CHEST COMPARISON:  Chest radiographs 03/04/2017. CT of the chest, abdomen and pelvis 10/22/2016. FINDINGS: The heart size and mediastinal contours are normal. The lungs are clear. There is no pleural effusion or pneumothorax. No acute osseous findings are identified. The bowel gas pattern is nonobstructive. There is limited abdominal bowel gas. No free intraperitoneal air or abnormal air-fluid levels. There are no suspicious abdominal calcifications. There is a mild thoracolumbar scoliosis which appears unchanged. IMPRESSION: 1. No acute cardiopulmonary or abdominal process. 2. Thoracolumbar scoliosis. Electronically Signed   By: Carey BullocksWilliam  Veazey M.D.   On: 04/06/2017 12:59    ____________________________________________  PROCEDURES Procedures  ____________________________________________     CLINICAL IMPRESSION / ASSESSMENT AND PLAN / ED COURSE  Pertinent labs & imaging results that were available during my care of the patient were reviewed by me and considered in my medical decision making (see chart for details).     Clinical Course as of Apr 06 1553  Wed Apr 06, 2017  1213 P/w likely viral syndrome. Will check labs, cxr, give IVF hydration and sx control, f/u clinical progress.   [PS]  1213 Considering the patient's symptoms, medical history, and physical examination today, I have low suspicion for cholecystitis or biliary pathology, pancreatitis, perforation or bowel obstruction, hernia, intra-abdominal abscess, AAA or dissection, volvulus or intussusception, mesenteric ischemia, or appendicitis.   [PS]  1335 Tachycardia improving. Will give 3rd liter ivf. Reglan for persistent nausea.   [PS]    Clinical Course User Index [PS] Sharman CheekStafford, Zarra Geffert, MD    ----------------------------------------- 3:53 PM on 04/06/2017 -----------------------------------------  Tachypnea improved, which was likely related to his  metabolic acidosis and a compensatory respiratory alkalosis. This suggested overall his serum labs are improving with hydration as well. Symptoms are gradually improving although still significantly tachycardic and comfortable with nausea and pain, necessitating hospitalization for further management. Case discussed with the hospitalist. Abdomen is still relatively benign and nonsurgical, I don't see an indication for CT scan at this time.   ____________________________________________   FINAL CLINICAL IMPRESSION(S) / ED DIAGNOSES    Final diagnoses:  Intractable vomiting with nausea, unspecified vomiting type  High anion gap metabolic acidosis  Acute renal insufficiency      This SmartLink is deprecated. Use AVSMEDLIST instead to display the medication list for a patient.   Portions of this note were generated with dragon dictation software. Dictation errors may occur despite best attempts at proofreading.    Sharman CheekStafford, Makinley Muscato, MD 04/06/17 [phone]

## 2017-04-07 LAB — BASIC METABOLIC PANEL
Anion gap: 13 (ref 5–15)
BUN: 12 mg/dL (ref 6–20)
CO2: 25 mmol/L (ref 22–32)
Calcium: 7.9 mg/dL — ABNORMAL LOW (ref 8.9–10.3)
Chloride: 93 mmol/L — ABNORMAL LOW (ref 101–111)
Creatinine, Ser: 1 mg/dL (ref 0.61–1.24)
GFR calc Af Amer: >60 mL/min (ref >60)
GFR calc non Af Amer: >60 mL/min (ref >60)
Glucose, Bld: 133 mg/dL — ABNORMAL HIGH (ref 65–99)
Potassium: 3.4 mmol/L — ABNORMAL LOW (ref 3.5–5.1)
Sodium: 131 mmol/L — ABNORMAL LOW (ref 135–145)

## 2017-04-07 LAB — CBC
HEMATOCRIT: 39.5 % — AB (ref 40.0–52.0)
HEMOGLOBIN: 13.7 g/dL (ref 13.0–18.0)
MCH: 33.5 pg (ref 26.0–34.0)
MCHC: 34.7 g/dL (ref 32.0–36.0)
MCV: 96.5 fL (ref 80.0–100.0)
Platelets: 94 10*3/uL — ABNORMAL LOW (ref 150–440)
RBC: 4.09 MIL/uL — ABNORMAL LOW (ref 4.40–5.90)
RDW: 14.6 % — AB (ref 11.5–14.5)
WBC: 5.8 10*3/uL (ref 3.8–10.6)

## 2017-04-07 LAB — HEPATIC FUNCTION PANEL
ALBUMIN: 3.9 g/dL (ref 3.5–5.0)
ALK PHOS: 46 U/L (ref 38–126)
ALT: 47 U/L (ref 17–63)
AST: 66 U/L — ABNORMAL HIGH (ref 15–41)
BILIRUBIN TOTAL: 1.6 mg/dL — AB (ref 0.3–1.2)
Bilirubin, Direct: 0.2 mg/dL (ref 0.1–0.5)
Indirect Bilirubin: 1.4 mg/dL — ABNORMAL HIGH (ref 0.3–0.9)
Total Protein: 6.2 g/dL — ABNORMAL LOW (ref 6.5–8.1)

## 2017-04-07 LAB — LIPASE, BLOOD: Lipase: 78 U/L — ABNORMAL HIGH (ref 11–51)

## 2017-04-07 MED ORDER — SODIUM CHLORIDE 0.9 % IV SOLN
INTRAVENOUS | Status: DC
  Administered 2017-04-07 (×2): via INTRAVENOUS

## 2017-04-07 MED ORDER — POTASSIUM CHLORIDE 10 MEQ/100ML IV SOLN
10.0000 meq | INTRAVENOUS | Status: AC
  Administered 2017-04-07 (×4): 10 meq via INTRAVENOUS
  Filled 2017-04-07 (×3): qty 100

## 2017-04-07 NOTE — Progress Notes (Signed)
[facility] - [place] [facility] - [facility]   PATIENT NAME: Paul Miles    MR#:  [id number]  DATE OF BIRTH:  1980/08/18  SUBJECTIVE:  CHIEF COMPLAINT:   Chief Complaint  Patient presents with  . Emesis   - feels better, nausea is improving - still has epigastric abdominal pain and heart burn  REVIEW OF SYSTEMS:  Review of Systems  Constitutional: Negative for chills and fever.  HENT: Negative for congestion, ear discharge, hearing loss and nosebleeds.   Eyes: Negative for blurred vision and double vision.  Respiratory: Negative for cough, shortness of breath and wheezing.   Cardiovascular: Negative for chest pain, palpitations and leg swelling.  Gastrointestinal: Positive for abdominal pain, heartburn and nausea. Negative for constipation, diarrhea and vomiting.  Genitourinary: Negative for dysuria and urgency.  Musculoskeletal: Negative for myalgias.  Neurological: Negative for dizziness, speech change, focal weakness, seizures and headaches.  Psychiatric/Behavioral: Negative for depression.    DRUG ALLERGIES:  No Known Allergies  VITALS:  Blood pressure 121/77, pulse (!) 115, temperature 98.2 F (36.8 C), temperature source Oral, resp. rate 20, height 6' (1.829 m), weight 81.6 kg (180 lb), SpO2 100 %.  PHYSICAL EXAMINATION:  Physical Exam  GENERAL:  36 y.o.-year-old patient lying in the bed with no acute distress.  EYES: Pupils equal, round, reactive to light and accommodation. No scleral icterus. Extraocular muscles intact.  HEENT: Head atraumatic, normocephalic. Oropharynx and nasopharynx clear.  NECK:  Supple, no jugular venous distention. No thyroid enlargement, no tenderness.  LUNGS: Normal breath sounds bilaterally, no wheezing, rales,rhonchi or crepitation. No use of accessory muscles of respiration.  CARDIOVASCULAR: S1, S2 normal. No murmurs, rubs, or gallops.  ABDOMEN: Soft, epigastric tenderness with no guarding or rigidity, nondistended. Bowel  sounds present. No organomegaly or mass.  EXTREMITIES: No pedal edema, cyanosis, or clubbing.  NEUROLOGIC: Cranial nerves II through XII are intact. Muscle strength 5/5 in all extremities. Sensation intact. Gait not checked.  PSYCHIATRIC: The patient is alert and oriented x 3.  SKIN: No obvious rash, lesion, or ulcer.    LABORATORY PANEL:   CBC Recent Labs  Lab 04/07/17 0349  WBC 5.8  HGB 13.7  HCT 39.5*  PLT 94*   ------------------------------------------------------------------------------------------------------------------  Chemistries  Recent Labs  Lab 04/07/17 0349  NA 131*  K 3.4*  CL 93*  CO2 25  GLUCOSE 133*  BUN 12  CREATININE 1.00  CALCIUM 7.9*  AST 66*  ALT 47  ALKPHOS 46  BILITOT 1.6*   ------------------------------------------------------------------------------------------------------------------  Cardiac Enzymes No results for input(s): TROPONINI in the last 168 hours. ------------------------------------------------------------------------------------------------------------------  RADIOLOGY:  Ct Abdomen Pelvis W Contrast  Result Date: 04/06/2017 CLINICAL DATA:  Abdominal pain with nausea and vomiting EXAM: CT ABDOMEN AND PELVIS WITH CONTRAST TECHNIQUE: Multidetector CT imaging of the abdomen and pelvis was performed using the standard protocol following bolus administration of intravenous contrast. Oral contrast was also administered. CONTRAST:  100mL ISOVUE-300 IOPAMIDOL (ISOVUE-300) INJECTION 61% COMPARISON:  October 22, 2016 FINDINGS: Lower chest: There is bibasilar atelectatic change. There is a focal hiatal hernia containing oral contrast. Hepatobiliary: There is hepatic steatosis. No focal liver lesions are appreciable. There is no gallbladder wall thickening evident. There is no biliary duct dilatation. Pancreas: There is mesenteric stranding between the pancreatic head and proximal duodenum. There is no associated mass. There is no peripancreatic  fluid collection or pancreatic duct dilatation. There is no distortion of the pancreatic contour. Spleen: No splenic lesions are evident. Adrenals/Urinary Tract: Adrenals appear normal bilaterally.  Kidneys bilaterally show no evident mass or hydronephrosis on either side. There is no renal or ureteral calculus on either side. Urinary bladder is midline with wall thickness within normal limits. Stomach/Bowel: There is mesenteric thickening adjacent to the proximal duodenum, located between the proximal duodenum in the head of the pancreas as described in the pancreatic section. There is no appreciable bowel wall or mesenteric thickening. There are scattered colonic diverticula without diverticulitis. No evident bowel obstruction. No free air or portal venous air. Vascular/Lymphatic: There is no abdominal aortic aneurysm. There is no evident vascular lesion. There is no adenopathy in the abdomen or pelvis. Reproductive: Prostate and seminal vesicles are normal in size and contour. There is no evident pelvic mass. Other: Appendix appears normal. There is no abscess or ascites in the abdomen or pelvis. Musculoskeletal: There are no blastic or lytic bone lesions. There is no intramuscular or abdominal wall lesion. IMPRESSION: 1. Localized mesenteric inflammation between the head of the pancreas and the second portion of the duodenum. Question early pancreatitis versus duodenitis. Appropriate laboratory correlation with respect to potential early acute pancreatitis advised. Note that the pancreas otherwise appears normal. Note also that there does not appear to be significant thickening of the wall of the duodenum in the area of adjacent mesenteric thickening. 2. Focal hiatal hernia with oral contrast within the hernia, likely indicating a degree of spontaneous gastroesophageal reflux. 3.  Scattered colonic diverticulosis without diverticulitis. 4. No evident bowel obstruction. No abscess. Appendix appears normal. 5.   Hepatic steatosis. 6.  No renal or ureteral calculus.  No hydronephrosis. Electronically Signed   By: Bretta Bang III M.D.   On: 04/06/2017 19:26   Dg Abdomen Acute W/chest  Result Date: 04/06/2017 CLINICAL DATA:  Vomiting with hemoptysis for 2 days. Limited oral intake. Weakness, erythema and shivering. EXAM: DG ABDOMEN ACUTE W/ 1V CHEST COMPARISON:  Chest radiographs 03/04/2017. CT of the chest, abdomen and pelvis 10/22/2016. FINDINGS: The heart size and mediastinal contours are normal. The lungs are clear. There is no pleural effusion or pneumothorax. No acute osseous findings are identified. The bowel gas pattern is nonobstructive. There is limited abdominal bowel gas. No free intraperitoneal air or abnormal air-fluid levels. There are no suspicious abdominal calcifications. There is a mild thoracolumbar scoliosis which appears unchanged. IMPRESSION: 1. No acute cardiopulmonary or abdominal process. 2. Thoracolumbar scoliosis. Electronically Signed   By: Carey Bullocks M.D.   On: 04/06/2017 12:59    EKG:   Orders placed or performed during the hospital encounter of 04/06/17  . EKG 12-Lead  . EKG 12-Lead    ASSESSMENT AND PLAN:   Paul Miles  is a 36 y.o. male with a known history of asthma, IBS presents to hospital secondary to worsening abdominal pain, nausea and vomiting  1.  acute pancreatitis-could be alcoholic, though patient is downplaying the amount of alcohol he drinks  -KUB with no obstruction. -CT of the abdomen showing acute pancreatitis and peripancreatic inflammation -LFTs are slowly improving. Symptoms are getting better. -We'll start on clears today. Continue fluids and symptomatic treatment  2. Severe metabolic acidosis-with the low bicarbonate and elevated potassium -Much improved with bicarbonate drip. Fluids changed to normal saline. -Continue monitoring  3. Acute renal failure-prerenal azotemia. Monitor with fluids. -Avoid nephrotoxins -Much improved  today  4. Elevated LFTs and lipase- from acute pancreatitis. -Previously elevated AST noted. Likely from alcohol use. CT abdomen with hepatic steatosis noted. -Continue to monitor  5. Hypokalemia-being replaced  6. GERD symptoms-started on Protonix  7. Tobacco use disorder-on nicotine patch  8. DVT prophylaxis-Lovenox    All the records are reviewed and case discussed with Care Management/Social Workerr. Management plans discussed with the patient, family and they are in agreement.  CODE STATUS: Full Code  TOTAL TIME TAKING CARE OF THIS PATIENT: 38 minutes.   POSSIBLE D/C IN 1-2 DAYS, DEPENDING ON CLINICAL CONDITION.   Enid BaasKALISETTI,Lorie Cleckley M.D on 04/07/2017 at 12:45 PM  Between 7am to 6pm - Pager - [phone]  After 6pm go to [email or web address] - Social research officer, governmentpassword EPAS [email or web address]  [facility] [address] [facility]  Office  [phone]  CC: Primary care physician; Jeannie DonePolanco, Leonard F, MD

## 2017-04-07 NOTE — Progress Notes (Signed)
Order received from Dr Nemiah CommanderKalisetti to discontinue the bicarb IVF order

## 2017-04-07 NOTE — Progress Notes (Signed)
Patient tolerated his clear liquid diet.  He only required pain medication twice today

## 2017-04-08 LAB — COMPREHENSIVE METABOLIC PANEL
ALT: 40 U/L (ref 17–63)
ANION GAP: 12 (ref 5–15)
AST: 63 U/L — ABNORMAL HIGH (ref 15–41)
Albumin: 4 g/dL (ref 3.5–5.0)
Alkaline Phosphatase: 49 U/L (ref 38–126)
BILIRUBIN TOTAL: 1.8 mg/dL — AB (ref 0.3–1.2)
BUN: 5 mg/dL — AB (ref 6–20)
CALCIUM: 8.6 mg/dL — AB (ref 8.9–10.3)
CO2: 26 mmol/L (ref 22–32)
Chloride: 98 mmol/L — ABNORMAL LOW (ref 101–111)
Creatinine, Ser: 0.92 mg/dL (ref 0.61–1.24)
GFR calc Af Amer: >60 mL/min (ref >60)
Glucose, Bld: 101 mg/dL — ABNORMAL HIGH (ref 65–99)
POTASSIUM: 3.2 mmol/L — AB (ref 3.5–5.1)
Sodium: 136 mmol/L (ref 135–145)
TOTAL PROTEIN: 6.7 g/dL (ref 6.5–8.1)

## 2017-04-08 LAB — HIV ANTIBODY (ROUTINE TESTING W REFLEX): HIV Screen 4th Generation wRfx: NONREACTIVE

## 2017-04-08 LAB — LIPASE, BLOOD: LIPASE: 66 U/L — AB (ref 11–51)

## 2017-04-08 MED ORDER — TRAMADOL HCL 50 MG PO TABS: 50.0000 mg | ORAL_TABLET | Freq: Four times a day (QID) | ORAL | 0 refills | Status: AC | PRN

## 2017-04-08 MED ORDER — PANTOPRAZOLE SODIUM 40 MG PO TBEC: 40.0000 mg | DELAYED_RELEASE_TABLET | Freq: Every day | ORAL | 2 refills | Status: AC

## 2017-04-08 MED ORDER — ONDANSETRON 4 MG PO TBDP: 4.0000 mg | ORAL_TABLET | Freq: Three times a day (TID) | ORAL | 0 refills | Status: DC | PRN

## 2017-04-08 MED ORDER — POTASSIUM CHLORIDE CRYS ER 20 MEQ PO TBCR
40.0000 meq | EXTENDED_RELEASE_TABLET | Freq: Once | ORAL | Status: AC
  Administered 2017-04-08: 40 meq via ORAL
  Filled 2017-04-08: qty 2

## 2017-04-08 NOTE — Progress Notes (Signed)
Discharge teaching given to patient, patient verbalized understanding and had no questions. Patient IV removed. Patient will be transported home by family. All patient belongings gathered prior to leaving.  

## 2017-04-08 NOTE — Discharge Summary (Signed)
[facility] - [place] [facility]   PATIENT NAME: Paul Miles    MR#:  [id number]  DATE OF BIRTH:  [birth year]  DATE OF ADMISSION:  04/06/2017   ADMITTING PHYSICIAN: Paul Baasadhika Deeric Cruise, MD  DATE OF DISCHARGE: 04/08/17  PRIMARY CARE PHYSICIAN: Paul Miles, Paul F, MD   ADMISSION DIAGNOSIS:   Acute renal insufficiency [N28.9] High anion gap metabolic acidosis [E87.2] Intractable vomiting with nausea, unspecified vomiting type [R11.2]  DISCHARGE DIAGNOSIS:   Active Problems:   Gastroenteritis   SECONDARY DIAGNOSIS:   Past Medical History:  Diagnosis Date  . Asthma   . Gout   . IBS (irritable bowel syndrome)   . Tremor     HOSPITAL COURSE:   JohnMaxwellis a36 y.o.malewith a known history of asthma, IBS presents to hospital secondary to worsening abdominal pain, nausea and vomiting  1. acute pancreatitis- acute alcoholic pancreatitis- patient acknowledges today that he does drink a lot and his family is not aware of it  -KUB with no obstruction. -CT of the abdomen showing acute pancreatitis and peripancreatic inflammation -LFTs are improving. Symptoms are getting better. -tolerating clears well- advance diet as tolerated  2. Severe metabolic acidosis-with the low bicarbonate and elevated potassium -improved with bicarbonate drip. Fluids changed to normal saline. -Continue monitoring  3. Acute renal failure-prerenal azotemia. Monitor with fluids. -Avoid nephrotoxins -Much improved today  4. Elevated LFTs and lipase- from acute pancreatitis. -Previously elevated AST noted. Likely from alcohol use. CT abdomen with hepatic steatosis noted. -Continue to monitor  5. Alcohol use disorder- counseled, requesting rehab information  6. GERD symptoms-started on Protonix  7. Tobacco use disorder-on nicotine patch    DISCHARGE CONDITIONS:   Guarded  CONSULTS OBTAINED:   Home  DRUG ALLERGIES:   No Known Allergies DISCHARGE  MEDICATIONS:   Allergies as of 04/08/2017   No Known Allergies     Medication List    STOP taking these medications   meloxicam 15 MG tablet Commonly known as:  MOBIC   methocarbamol 500 MG tablet Commonly known as:  ROBAXIN   predniSONE 50 MG tablet Commonly known as:  DELTASONE     TAKE these medications   albuterol 108 (90 Base) MCG/ACT inhaler Commonly known as:  PROVENTIL HFA;VENTOLIN HFA Inhale 2 puffs into the lungs every 6 (six) hours as needed for wheezing or shortness of breath.   ondansetron 4 MG disintegrating tablet Commonly known as:  ZOFRAN ODT Take 1 tablet (4 mg total) by mouth every 8 (eight) hours as needed for nausea or vomiting.   pantoprazole 40 MG tablet Commonly known as:  PROTONIX Take 1 tablet (40 mg total) by mouth daily.   traMADol 50 MG tablet Commonly known as:  ULTRAM Take 1 tablet (50 mg total) by mouth every 6 (six) hours as needed for moderate pain or severe pain.        DISCHARGE INSTRUCTIONS:   1. PCP f/u in 1-2 weeks  DIET:   Low fat diet  ACTIVITY:   Activity as tolerated  OXYGEN:   Home Oxygen: No.  Oxygen Delivery: room air  DISCHARGE LOCATION:   home   If you experience worsening of your admission symptoms, develop shortness of breath, life threatening emergency, suicidal or homicidal thoughts you must seek medical attention immediately by calling 911 or calling your MD immediately  if symptoms less severe.  You Must read complete instructions/literature along with all the possible adverse reactions/side effects for all the Medicines you take and that have been prescribed  to you. Take any new Medicines after you have completely understood and accpet all the possible adverse reactions/side effects.   Please note  You were cared for by a hospitalist during your hospital stay. If you have any questions about your discharge medications or the care you received while you were in the hospital after you are  discharged, you can call the unit and asked to speak with the hospitalist on call if the hospitalist that took care of you is not available. Once you are discharged, your primary care physician will handle any further medical issues. Please note that NO REFILLS for any discharge medications will be authorized once you are discharged, as it is imperative that you return to your primary care physician (or establish a relationship with a primary care physician if you do not have one) for your aftercare needs so that they can reassess your need for medications and monitor your lab values.    On the day of Discharge:  VITAL SIGNS:   Blood pressure (!) 143/80, pulse 98, temperature 98.2 F (36.8 C), temperature source Oral, resp. rate (!) 22, height 6' (1.829 m), weight 81.6 kg (180 lb), SpO2 98 %.  PHYSICAL EXAMINATION:   GENERAL:  36 y.o.-year-old patient lying in the bed with no acute distress.  EYES: Pupils equal, round, reactive to light and accommodation. No scleral icterus. Extraocular muscles intact.  HEENT: Head atraumatic, normocephalic. Oropharynx and nasopharynx clear.  NECK:  Supple, no jugular venous distention. No thyroid enlargement, no tenderness.  LUNGS: Normal breath sounds bilaterally, no wheezing, rales,rhonchi or crepitation. No use of accessory muscles of respiration.  CARDIOVASCULAR: S1, S2 normal. No murmurs, rubs, or gallops.  ABDOMEN: Soft, non tender, nondistended. Bowel sounds present. No organomegaly or mass.  EXTREMITIES: No pedal edema, cyanosis, or clubbing.  NEUROLOGIC: Cranial nerves II through XII are intact. Muscle strength 5/5 in all extremities. Sensation intact. Gait not checked.  PSYCHIATRIC: The patient is alert and oriented x 3.  SKIN: No obvious rash, lesion, or ulcer.    DATA REVIEW:   CBC Recent Labs  Lab 04/07/17 0349  WBC 5.8  HGB 13.7  HCT 39.5*  PLT 94*    Chemistries  Recent Labs  Lab 04/08/17 0348  NA 136  K 3.2*  CL 98*  CO2  26  GLUCOSE 101*  BUN 5*  CREATININE 0.92  CALCIUM 8.6*  AST 63*  ALT 40  ALKPHOS 49  BILITOT 1.8*     Microbiology Results  Results for orders placed or performed during the hospital encounter of 04/06/17  CULTURE, BLOOD (ROUTINE X 2) w Reflex to ID Panel     Status: None (Preliminary result)   Collection Time: 04/06/17  7:39 PM  Result Value Ref Range Status   Specimen Description BLOOD RAC  Final   Special Requests   Final    BOTTLES DRAWN AEROBIC AND ANAEROBIC Blood Culture adequate volume   Culture NO GROWTH 2 DAYS  Final   Report Status PENDING  Incomplete  CULTURE, BLOOD (ROUTINE X 2) w Reflex to ID Panel     Status: None (Preliminary result)   Collection Time: 04/06/17  7:46 PM  Result Value Ref Range Status   Specimen Description BLOOD LEFT HAND  Final   Special Requests   Final    BOTTLES DRAWN AEROBIC AND ANAEROBIC Blood Culture adequate volume   Culture NO GROWTH 2 DAYS  Final   Report Status PENDING  Incomplete    RADIOLOGY:  No results found.  Management plans discussed with the patient, family and they are in agreement.  CODE STATUS:     Code Status Orders  (From admission, onward)        Start     Ordered   04/06/17 1828  Full code  Continuous     04/06/17 1827    Code Status History    Date Active Date Inactive Code Status Order ID Comments User Context   This patient has a current code status but no historical code status.      TOTAL TIME TAKING CARE OF THIS PATIENT: 37 minutes.    Paul Baas M.D on 04/08/2017 at 10:32 AM  Between 7am to 6pm - Pager - [phone]  After 6pm go to [email or web address] - Social research officer, government  [facility]  Office  [phone]  CC: Primary care physician; Paul Done, MD   Note: This dictation was prepared with Dragon dictation along with smaller phrase technology. Any transcriptional errors that result from this process are unintentional.

## 2017-04-08 NOTE — Progress Notes (Signed)
Patient woke up  Early this am and IV tubing was twisted; and patient became fearful; staff evaluated patient and heart rate was  was elevated; Clinical research associatewriter evaluated patient after staff made aware and admission; patient heart rate was 98 and patient request not have IV; staitng that he would be discharge today and felt fine; just woke up with IV twisted; Dr. Tobi BastosPyreddy made aware and OK with leaving IV out; Will continue to monitor..Marland Kitchen

## 2017-04-08 NOTE — Care Management (Signed)
Patient admitted for acute pancreatitis.  Patient self pay.  To discharge today.  PCP POLANCO, LEONARD.  Patient request that goodrx coupons be printed for [facility]. RNCM informed patient that [facility] prices were significantly cheaper.  Patient denies issues obtaining medications, and still request [facility].  Coupons provided RNCM signing off.

## 2017-04-11 LAB — CULTURE, BLOOD (ROUTINE X 2)
Culture: NO GROWTH
Culture: NO GROWTH
SPECIAL REQUESTS: ADEQUATE
Special Requests: ADEQUATE

## 2017-06-01 ENCOUNTER — Emergency Department
Admission: EM | Admit: 2017-06-01 | Discharge: 2017-06-01 | Disposition: A | Discharge status: Home / Self Care | Payer: Self-pay | Attending: Emergency Medicine | Admitting: Emergency Medicine

## 2017-06-01 ENCOUNTER — Encounter: Payer: Self-pay | Admitting: Emergency Medicine

## 2017-06-01 ENCOUNTER — Emergency Department: Payer: Self-pay

## 2017-06-01 ENCOUNTER — Other Ambulatory Visit: Payer: Self-pay

## 2017-06-01 DIAGNOSIS — A084 Viral intestinal infection, unspecified: Secondary | ICD-10-CM | POA: Insufficient documentation

## 2017-06-01 DIAGNOSIS — J45909 Unspecified asthma, uncomplicated: Secondary | ICD-10-CM | POA: Insufficient documentation

## 2017-06-01 DIAGNOSIS — F10230 Alcohol dependence with withdrawal, uncomplicated: Secondary | ICD-10-CM | POA: Insufficient documentation

## 2017-06-01 DIAGNOSIS — F1210 Cannabis abuse, uncomplicated: Secondary | ICD-10-CM | POA: Insufficient documentation

## 2017-06-01 DIAGNOSIS — R443 Hallucinations, unspecified: Secondary | ICD-10-CM | POA: Insufficient documentation

## 2017-06-01 DIAGNOSIS — F17210 Nicotine dependence, cigarettes, uncomplicated: Secondary | ICD-10-CM | POA: Insufficient documentation

## 2017-06-01 DIAGNOSIS — F10930 Alcohol use, unspecified with withdrawal, uncomplicated: Secondary | ICD-10-CM

## 2017-06-01 DIAGNOSIS — R251 Tremor, unspecified: Secondary | ICD-10-CM | POA: Insufficient documentation

## 2017-06-01 DIAGNOSIS — F10920 Alcohol use, unspecified with intoxication, uncomplicated: Secondary | ICD-10-CM | POA: Insufficient documentation

## 2017-06-01 DIAGNOSIS — R112 Nausea with vomiting, unspecified: Secondary | ICD-10-CM

## 2017-06-01 DIAGNOSIS — E860 Dehydration: Secondary | ICD-10-CM

## 2017-06-01 DIAGNOSIS — R531 Weakness: Secondary | ICD-10-CM | POA: Insufficient documentation

## 2017-06-01 DIAGNOSIS — Z79899 Other long term (current) drug therapy: Secondary | ICD-10-CM | POA: Insufficient documentation

## 2017-06-01 DIAGNOSIS — R197 Diarrhea, unspecified: Secondary | ICD-10-CM | POA: Insufficient documentation

## 2017-06-01 DIAGNOSIS — R198 Other specified symptoms and signs involving the digestive system and abdomen: Secondary | ICD-10-CM | POA: Insufficient documentation

## 2017-06-01 DIAGNOSIS — R55 Syncope and collapse: Secondary | ICD-10-CM | POA: Insufficient documentation

## 2017-06-01 DIAGNOSIS — E162 Hypoglycemia, unspecified: Secondary | ICD-10-CM | POA: Insufficient documentation

## 2017-06-01 LAB — CBC
HEMATOCRIT: 45.3 % (ref 40.0–52.0)
HEMATOCRIT: 40.4 % (ref 40.0–52.0)
HEMOGLOBIN: 15.5 g/dL (ref 13.0–18.0)
HEMOGLOBIN: 13.8 g/dL (ref 13.0–18.0)
MCH: 33.8 pg (ref 26.0–34.0)
MCH: 34.5 pg — ABNORMAL HIGH (ref 26.0–34.0)
MCHC: 34.3 g/dL (ref 32.0–36.0)
MCHC: 34.3 g/dL (ref 32.0–36.0)
MCV: 98.7 fL (ref 80.0–100.0)
MCV: 100.5 fL — AB (ref 80.0–100.0)
Platelets: 133 10*3/uL — ABNORMAL LOW (ref 150–440)
Platelets: 105 10*3/uL — ABNORMAL LOW (ref 150–440)
RBC: 4.59 MIL/uL (ref 4.40–5.90)
RBC: 4.02 MIL/uL — ABNORMAL LOW (ref 4.40–5.90)
RDW: 15.1 % — ABNORMAL HIGH (ref 11.5–14.5)
RDW: 14.7 % — AB (ref 11.5–14.5)
WBC: 5.9 10*3/uL (ref 3.8–10.6)
WBC: 4.5 10*3/uL (ref 3.8–10.6)

## 2017-06-01 LAB — BASIC METABOLIC PANEL
ANION GAP: 19 — AB (ref 5–15)
ANION GAP: 19 — AB (ref 5–15)
BUN: 11 mg/dL (ref 6–20)
BUN: 7 mg/dL (ref 6–20)
CALCIUM: 8 mg/dL — AB (ref 8.9–10.3)
CHLORIDE: 106 mmol/L (ref 101–111)
CO2: 19 mmol/L — AB (ref 22–32)
CO2: 16 mmol/L — ABNORMAL LOW (ref 22–32)
Calcium: 9 mg/dL (ref 8.9–10.3)
Chloride: 102 mmol/L (ref 101–111)
Creatinine, Ser: 0.79 mg/dL (ref 0.61–1.24)
Creatinine, Ser: 0.68 mg/dL (ref 0.61–1.24)
GFR calc non Af Amer: >60 mL/min (ref >60)
GLUCOSE: 95 mg/dL (ref 65–99)
GLUCOSE: 64 mg/dL — AB (ref 65–99)
POTASSIUM: 3.8 mmol/L (ref 3.5–5.1)
POTASSIUM: 4 mmol/L (ref 3.5–5.1)
Sodium: 140 mmol/L (ref 135–145)
Sodium: 141 mmol/L (ref 135–145)

## 2017-06-01 LAB — GLUCOSE, CAPILLARY: GLUCOSE-CAPILLARY: 107 mg/dL — AB (ref 65–99)

## 2017-06-01 LAB — TROPONIN I

## 2017-06-01 LAB — ETHANOL: Alcohol, Ethyl (B): 279 mg/dL — ABNORMAL HIGH (ref <10)

## 2017-06-01 MED ORDER — ONDANSETRON HCL 4 MG/2ML IJ SOLN
4.0000 mg | Freq: Once | INTRAMUSCULAR | Status: AC
  Administered 2017-06-01: 4 mg via INTRAVENOUS

## 2017-06-01 MED ORDER — CHLORDIAZEPOXIDE HCL 25 MG PO CAPS: ORAL_CAPSULE | ORAL | 0 refills | Status: DC

## 2017-06-01 MED ORDER — ONDANSETRON 4 MG PO TBDP: 4.0000 mg | ORAL_TABLET | Freq: Three times a day (TID) | ORAL | 0 refills | Status: DC | PRN

## 2017-06-01 MED ORDER — TRAMADOL HCL 50 MG PO TABS: 50.0000 mg | ORAL_TABLET | Freq: Once | ORAL | Status: DC

## 2017-06-01 MED ORDER — LORAZEPAM 1 MG PO TABS
1.0000 mg | ORAL_TABLET | Freq: Once | ORAL | Status: AC
  Administered 2017-06-01: 1 mg via ORAL
  Filled 2017-06-01: qty 1

## 2017-06-01 MED ORDER — TRAMADOL HCL 50 MG PO TABS
ORAL_TABLET | ORAL | Status: AC
  Filled 2017-06-01: qty 1

## 2017-06-01 MED ORDER — SODIUM CHLORIDE 0.9 % IV BOLUS (SEPSIS)
1000.0000 mL | Freq: Once | INTRAVENOUS | Status: AC
  Administered 2017-06-01: 1000 mL via INTRAVENOUS

## 2017-06-01 MED ORDER — LORAZEPAM 2 MG PO TABS
2.0000 mg | ORAL_TABLET | Freq: Once | ORAL | Status: AC
  Administered 2017-06-01: 2 mg via ORAL
  Filled 2017-06-01: qty 1

## 2017-06-01 MED ORDER — IOPAMIDOL (ISOVUE-300) INJECTION 61%
100.0000 mL | Freq: Once | INTRAVENOUS | Status: AC | PRN
Start: 2017-06-01 — End: 2017-06-01
  Administered 2017-06-01: 100 mL via INTRAVENOUS

## 2017-06-01 NOTE — ED Provider Notes (Signed)
[facility] Emergency Department Provider Note ____________________________________________   I have reviewed the triage vital signs and the triage nursing note.  HISTORY  Chief Complaint Seizures   Historian Patient and wife  HPI Paul Miles is a 37 y.o. male with a history of alcohol abuse, seen in the emergency department last night and found to have no acute findings on head CT or MRI of the brain that was done yesterday, but some chronic findings.  Patient was provided Librium prescription as well as Zofran, patient states that he did not get these until this afternoon.  Wife states that she found him on the ground with some spit up and difficult to arouse.  Here he is easily arousable and awake.  He states he is not sure how he got from the couch to the floor.  It is a soft ground.  He is not complaining of headache or vision changes.  No weakness or numbness.  There is no visualized seizure activity.  Patient and wife states he had a couple days of vomiting as well.  Patient feels okay right now.  Wife states he has not had a drink of alcohol for 24 hours, patient does not give me a clear answer.   Past Medical History:  Diagnosis Date  . Asthma   . Gout   . IBS (irritable bowel syndrome)   . Tremor     Patient Active Problem List   Diagnosis Date Noted  . Gastroenteritis 04/06/2017    History reviewed. No pertinent surgical history.  Prior to Admission medications   Medication Sig Start Date End Date Taking? Authorizing Provider  albuterol (PROVENTIL HFA;VENTOLIN HFA) 108 (90 Base) MCG/ACT inhaler Inhale 2 puffs into the lungs every 6 (six) hours as needed for wheezing or shortness of breath.    [provider]  chlordiazePOXIDE (LIBRIUM) 25 MG capsule 50 mg 3 times a day x 3 days 50 mg 2 times a day for 3 days 50 mg once a day for 3 days stop 06/01/17   Rebecka ApleyWebster, Allison P, MD  ondansetron (ZOFRAN ODT) 4 MG disintegrating  tablet Take 1 tablet (4 mg total) by mouth every 8 (eight) hours as needed for nausea or vomiting. 04/08/17   Enid BaasKalisetti, Radhika, MD  ondansetron (ZOFRAN ODT) 4 MG disintegrating tablet Take 1 tablet (4 mg total) by mouth every 8 (eight) hours as needed for nausea or vomiting. 06/01/17   Rebecka ApleyWebster, Allison P, MD  pantoprazole (PROTONIX) 40 MG tablet Take 1 tablet (40 mg total) by mouth daily. 04/08/17   Enid BaasKalisetti, Radhika, MD  traMADol (ULTRAM) 50 MG tablet Take 1 tablet (50 mg total) by mouth every 6 (six) hours as needed for moderate pain or severe pain. 04/08/17 04/08/18  Enid BaasKalisetti, Radhika, MD    No Known Allergies  Family History  Problem Relation Age of Onset  . Diabetes Mother   . CAD Mother     Social History Social History   Tobacco Use  . Smoking status: Current Every Day Smoker    Packs/day: 2.00    Types: Cigarettes  . Smokeless tobacco: Never Used  Substance Use Topics  . Alcohol use: Yes    Comment: daily  . Drug use: Yes    Types: Marijuana    Review of Systems  Constitutional: Negative for fever. Eyes: Negative for visual changes. ENT: Negative for sore throat. Cardiovascular: Negative for chest pain. Respiratory: Negative for shortness of breath. Gastrointestinal: Negative for abdominal pain, vomiting and diarrhea. Genitourinary:  Negative for dysuria. Musculoskeletal: Negative for back pain. Skin: Negative for rash. Neurological: Negative for headache.  ____________________________________________   PHYSICAL EXAM:  VITAL SIGNS: ED Triage Vitals  Enc Vitals Group     BP 06/01/17 1817 (!) 131/103     Pulse Rate 06/01/17 1817 87     Resp 06/01/17 1817 10     Temp 06/01/17 1817 98.5 F (36.9 C)     Temp Source 06/01/17 1817 Oral     SpO2 06/01/17 1817 100 %     Weight 06/01/17 1817 170 lb (77.1 kg)     Height 06/01/17 1817 6' (1.829 m)     Head Circumference --      Peak Flow --      Pain Score 06/01/17 1927 7     Pain Loc --      Pain Edu? --       Excl. in GC? --      Constitutional: Alert and oriented, not exactly a great historian. Well appearing and in no distress. HEENT   Head: Normocephalic and atraumatic.      Eyes: Conjunctivae are normal. Pupils equal and round.       Ears:         Nose: No congestion/rhinnorhea.   Mouth/Throat: Mucous membranes are moist.   Neck: No stridor. Cardiovascular/Chest: Normal rate, regular rhythm.  No murmurs, rubs, or gallops. Respiratory: Normal respiratory effort without tachypnea nor retractions. Breath sounds are clear and equal bilaterally. No wheezes/rales/rhonchi. Gastrointestinal: Soft. No distention, no guarding, no rebound. Nontender.    Genitourinary/rectal:Deferred Musculoskeletal: Nontender with normal range of motion in all extremities. No joint effusions.  No lower extremity tenderness.  No edema. Neurologic: No facial droop.  Normal speech and language. No gross or focal neurologic deficits are appreciated. Skin:  Skin is warm, dry and intact. No rash noted. Psychiatric: No suicidal ideation.  Mood and affect are normal. Speech and behavior are normal. Patient exhibits appropriate insight and judgment.   ____________________________________________  LABS (pertinent positives/negatives) I, Governor Rooks, MD the attending physician have reviewed the labs noted below.  Labs Reviewed  CBC - Abnormal; Notable for the following components:      Result Value   RBC 4.02 (*)    MCV 100.5 (*)    MCH 34.5 (*)    RDW 14.7 (*)    Platelets 105 (*)    All other components within normal limits  BASIC METABOLIC PANEL - Abnormal; Notable for the following components:   CO2 16 (*)    Glucose, Bld 64 (*)    Calcium 8.0 (*)    Anion gap 19 (*)    All other components within normal limits  ETHANOL - Abnormal; Notable for the following components:   Alcohol, Ethyl (B) 279 (*)    All other components within normal limits  CBG MONITORING, ED     ____________________________________________    EKG I, Governor Rooks, MD, the attending physician have personally viewed and interpreted all ECGs.  79 bpm.  Normal sinus rhythm.  Narrow QRS.  Normal axis.  Normal ST and T wave  ____________________________________________  RADIOLOGY All Xrays were viewed by me.  Imaging interpreted by Radiologist, and I, Governor Rooks, MD the attending physician have reviewed the radiologist interpretation noted below.  None __________________________________________  PROCEDURES  Procedure(s) performed: None  Critical Care performed: None   ____________________________________________  ED COURSE / ASSESSMENT AND PLAN  Pertinent labs & imaging results that were available during my care of the  patient were reviewed by me and considered in my medical decision making (see chart for details).   Patient brought in by wife after finding him on the floor next to the couch.  He does smell a bit like alcohol to me, but unclear to me whether or not he has been drinking today or not.  Discussed with patient and the family that if his alcohol level is elevated I suspect he probably was passed out, low suspicion for significant traumatic injury, as the distance from couch to floor is relatively low, and it was carpeted/soft, and there is no headache, neurologic symptoms.  We did discuss that as an alcoholic, he would be at more risk for easy bleeding from head trauma, however risk and repeat head CT in terms of radiation exposure is also consideration.  Patient and his wife did not were proceed with head CT at this point time.  If his alcohol level is undetectable, it is possible he could have had a seizure, he did not bite his tongue or urinate on himself.  He now has Librium that was picked up and he has it in his pill bottle.  We did discuss sedation effect of taking Librium with alcohol and advised against that.  Patient care transferred to  Dr. Pershing Proud at shift change 8pm.  Given hypoglycemia, will feed and recheck in an hour - suspect related to alchol use.  Suspect able to be discharged home, will prepare discharge instructions.  DIFFERENTIAL DIAGNOSIS: Including but not limited to alcohol intoxication, alcohol withdrawal seizure, syncope, electrolyte disturbance, etc.  CONSULTATIONS: None   Patient / Family / Caregiver informed of clinical course, medical decision-making process, and agree with plan.   I discussed return precautions, follow-up instructions, and discharge instructions with patient and/or family.  Discharge Instructions : You are found to be intoxicated, your blood sugar was also slightly low and you are given food.  Make sure you are eating every several hours.  Return to emergency department immediately for any worsening condition including confusion or altered mental status, seizure, or any other symptoms concerning to you.    ___________________________________________   FINAL CLINICAL IMPRESSION(S) / ED DIAGNOSES   Final diagnoses:  Alcoholic intoxication without complication (HCC)  Hypoglycemia      ___________________________________________        Note: This dictation was prepared with Dragon dictation. Any transcriptional errors that result from this process are unintentional    Governor Rooks, MD 06/01/17 2010

## 2017-06-01 NOTE — ED Triage Notes (Addendum)
Pt presents today for seizure like activity unwitnessed. Pt comes via [facility].f PT is currently attempted to self detox from alcohol pt states he drinks everyday 5 a day. Mixture of liquor and beer. Pt is A&Ox4 at this time. Pt states he has been drinking today but does not want fiance and daughter to know.

## 2017-06-01 NOTE — ED Provider Notes (Signed)
Signout from Dr. Shaune PollackLord to make sure that the patient is maintaining his glucose after eating.  Patient was found to have an elevated alcohol level and his symptoms were attributed to this.  Physical Exam  BP 139/86   Pulse 81   Temp 98.5 F (36.9 C) (Oral)   Resp 15   Ht 6' (1.829 m)   Wt 77.1 kg (170 lb)   SpO2 99%   BMI 23.06 kg/m  ----------------------------------------- 10:21 PM on 06/01/2017 -----------------------------------------   Physical Exam Patient awake and alert.  Not slurring his words.  Appears comfortable. ED Course/Procedures     Procedures  MDM  Patient has maintained his glucose.  Last reading of 107.  Patient will be discharged home at this time.       Myrna BlazerSchaevitz, Azalyn Sliwa Matthew, MD 06/01/17 2221

## 2017-06-01 NOTE — ED Notes (Signed)
Pt fiancee came to nurse station stating pt was having a seizure. RN and Lord went to room pt had eyes closed, no shaking jerking or abnormal activites. Pt opened eye to stimulation as well as able to confirm place, time. date and self. Pt has fiancee at bedside. RN will continue to monitor.

## 2017-06-01 NOTE — ED Notes (Signed)
Labs sent at this time.

## 2017-06-01 NOTE — Discharge Instructions (Signed)
Please follow up with [facility] for detox options. Please continue drinking fluids to avoid dehydration. Please take the librium to prevent withdrawal symptoms

## 2017-06-01 NOTE — ED Notes (Signed)
ED Provider at bedside. 

## 2017-06-01 NOTE — ED Notes (Signed)
BS 107

## 2017-06-01 NOTE — ED Notes (Signed)
This RN received a call from "wife" Lewie LoronMichelle Mundis. Pt gave verbal permission for RN to talk to her. Pt has fiancee at bedside. Pt is NAD A&Ox4

## 2017-06-01 NOTE — ED Provider Notes (Signed)
[facility] Emergency Department Provider Note   ____________________________________________   First MD Initiated Contact with Patient 06/01/17 0300     (approximate)  I have reviewed the triage vital signs and the nursing notes.   HISTORY  Chief Complaint Alcohol Problem; Dizziness; and Weakness    HPI NAMEER SUMMER is a 37 y.o. male who comes into the hospital today looking for help with his alcohol problem.  He states that he has been dealing with the flu and also having withdrawals.  He reports that he is been trying to detox from alcohol.  He has had the shakes and tonight his wife states that she found him on the floor in the [place].  The patient does not recall falling or passing out.  She reports that he was hallucinating and not acting right.  The patient's significant other states that he was talking to his mother who has passed away.  The patient has been sick for the last couple of weeks.  He was diagnosed with influenza about a week and a half ago.  He reports though that he still been vomiting and unable to keep down fluids.  The patient is also had some diarrhea and some pain when he has a bowel movement.  The patient has been weak and has been having some headaches.  He is here today for evaluation.  Past Medical History:  Diagnosis Date  . Asthma   . Gout   . IBS (irritable bowel syndrome)   . Tremor     Patient Active Problem List   Diagnosis Date Noted  . Gastroenteritis 04/06/2017    History reviewed. No pertinent surgical history.  Prior to Admission medications   Medication Sig Start Date End Date Taking? Authorizing Provider  albuterol (PROVENTIL HFA;VENTOLIN HFA) 108 (90 Base) MCG/ACT inhaler Inhale 2 puffs into the lungs every 6 (six) hours as needed for wheezing or shortness of breath.    [provider]  chlordiazePOXIDE (LIBRIUM) 25 MG capsule 50 mg 3 times a day x 3 days 50 mg 2 times a day for 3 days 50 mg once a  day for 3 days stop 06/01/17   Rebecka Apley, MD  ondansetron (ZOFRAN ODT) 4 MG disintegrating tablet Take 1 tablet (4 mg total) by mouth every 8 (eight) hours as needed for nausea or vomiting. 04/08/17   Enid Baas, MD  ondansetron (ZOFRAN ODT) 4 MG disintegrating tablet Take 1 tablet (4 mg total) by mouth every 8 (eight) hours as needed for nausea or vomiting. 06/01/17   Rebecka Apley, MD  pantoprazole (PROTONIX) 40 MG tablet Take 1 tablet (40 mg total) by mouth daily. 04/08/17   Enid Baas, MD  traMADol (ULTRAM) 50 MG tablet Take 1 tablet (50 mg total) by mouth every 6 (six) hours as needed for moderate pain or severe pain. 04/08/17 04/08/18  Enid Baas, MD    Allergies Patient has no known allergies.  Family History  Problem Relation Age of Onset  . Diabetes Mother   . CAD Mother     Social History Social History   Tobacco Use  . Smoking status: Current Every Day Smoker    Packs/day: 2.00    Types: Cigarettes  . Smokeless tobacco: Never Used  Substance Use Topics  . Alcohol use: Yes    Comment: daily  . Drug use: Yes    Types: Marijuana    Review of Systems  Constitutional: chills Eyes: No visual changes. ENT: No sore  throat. Cardiovascular: Denies chest pain. Respiratory: Denies shortness of breath. Gastrointestinal: abdominal pain, nausea, vomiting.  No diarrhea.  No constipation. Genitourinary: Negative for dysuria. Musculoskeletal: Negative for back pain. Skin: Negative for rash. Neurological: Headache, dizziness   ____________________________________________   PHYSICAL EXAM:  VITAL SIGNS: ED Triage Vitals  Enc Vitals Group     BP 06/01/17 0117 (!) 148/97     Pulse Rate 06/01/17 0117 (!) 106     Resp 06/01/17 0117 20     Temp 06/01/17 0117 98.5 F (36.9 C)     Temp Source 06/01/17 0117 Oral     SpO2 06/01/17 0117 96 %     Weight 06/01/17 0118 170 lb (77.1 kg)     Height 06/01/17 0118 6' (1.829 m)     Head  Circumference --      Peak Flow --      Pain Score 06/01/17 0118 7     Pain Loc --      Pain Edu? --      Excl. in GC? --    Constitutional: Alert and oriented. Well appearing and in moderate distress. Eyes: Conjunctivae are normal. PERRL. EOMI. Head: Atraumatic. Nose: No congestion/rhinnorhea. Mouth/Throat: Mucous membranes are moist.  Oropharynx non-erythematous. Cardiovascular: Tachycardia regular rhythm. Grossly normal heart sounds.  Good peripheral circulation. Respiratory: Normal respiratory effort.  No retractions. Lungs CTAB. Gastrointestinal: Soft with some mild diffuse abdominal pain. No distention.  Positive bowel sounds Musculoskeletal: No lower extremity tenderness nor edema.   Neurologic:  Normal speech and language.  The patient does have some right-sided facial droop but his remaining cranial nerves are intact, his strength is 5 out of 5 in his upper and lower extremity sensation is intact throughout as well. Skin:  Skin is warm, dry and intact.  Psychiatric: Mood and affect are normal.   ____________________________________________   LABS (all labs ordered are listed, but only abnormal results are displayed)  Labs Reviewed  BASIC METABOLIC PANEL - Abnormal; Notable for the following components:      Result Value   CO2 19 (*)    Anion gap 19 (*)    All other components within normal limits  CBC - Abnormal; Notable for the following components:   RDW 15.1 (*)    Platelets 133 (*)    All other components within normal limits  TROPONIN I  URINALYSIS, COMPLETE (UACMP) WITH MICROSCOPIC  CBG MONITORING, ED   ____________________________________________  EKG  ED ECG REPORT I, Rebecka Apley, the attending physician, personally viewed and interpreted this ECG.   Date: 06/01/2017  EKG Time: 119  Rate: 104  Rhythm: sinus tachycardia  Axis: normal  Intervals:none  ST&T Change: normal  ____________________________________________  RADIOLOGY  ED MD  interpretation:  CT head: NAD                                      CT abd and pelvis: NAD                                      MRI brain: Scattered white matter signal changes mildly advanced for  age and nonspecific.  Official radiology report(s): Ct Head Wo Contrast  Result Date: 06/01/2017 CLINICAL DATA:  Dizziness, weakness, nausea, vomiting and diarrhea for 2 weeks. Potential alcohol withdrawal. EXAM: CT HEAD WITHOUT CONTRAST TECHNIQUE: Contiguous axial images were obtained from the base of the skull through the vertex without intravenous contrast. COMPARISON:  CT HEAD October 22, 2016 FINDINGS: BRAIN: No intraparenchymal hemorrhage, mass effect nor midline shift. Borderline parenchymal brain volume loss. No hydrocephalus. No acute large vascular territory infarcts. No abnormal extra-axial fluid collections. Basal cisterns are patent. VASCULAR: Unremarkable. SKULL/SOFT TISSUES: No skull fracture. No significant soft tissue swelling. ORBITS/SINUSES: The included ocular globes and orbital contents are normal.Mild paranasal sinus mucosal thickening without air-fluid levels. Included mastoid air cells are well aerated. OTHER: None. IMPRESSION: 1. No acute intracranial process. 2. Borderline parenchymal brain volume loss for age. Electronically Signed   By: Awilda Metro M.D.   On: 06/01/2017 04:22   Mr Brain Wo Contrast  Result Date: 06/01/2017 CLINICAL DATA:  Dizziness, weakness, nausea, vomiting, and diarrhea for 2 weeks. Alcohol use. EXAM: MRI HEAD WITHOUT CONTRAST TECHNIQUE: Multiplanar, multiecho pulse sequences of the brain and surrounding structures were obtained without intravenous contrast. COMPARISON:  Head CT 06/01/2017 FINDINGS: Brain: There is no evidence of acute infarct, intracranial hemorrhage, mass, midline shift, or extra-axial fluid collection. Scattered small foci of T2 hyperintensity in the subcortical and deep cerebral white matter bilaterally are  mild but greater than expected for age. Cerebral volume appears borderline to abnormally low for age. Vascular: The distal left vertebral artery appears hypoplastic. Other major intracranial vascular flow voids are preserved. Skull and upper cervical spine: No suspicious marrow lesion. Sinuses/Orbits: Unremarkable orbits. Minimal bilateral maxillary sinus mucosal thickening. Clear mastoid air cells. Other: None. IMPRESSION: 1. No acute intracranial abnormality. 2. Scattered cerebral white matter T2 signal changes, mildly advanced for age and nonspecific. Considerations include early chronic small vessel ischemic disease, migraines, vasculitis, demyelination, and prior infection/inflammation. Electronically Signed   By: Sebastian Ache M.D.   On: 06/01/2017 07:03   Ct Abdomen Pelvis W Contrast  Result Date: 06/01/2017 CLINICAL DATA:  Dizziness, nausea, vomiting and diarrhea for 2 weeks. Potential alcohol withdrawal. EXAM: CT ABDOMEN AND PELVIS WITH CONTRAST TECHNIQUE: Multidetector CT imaging of the abdomen and pelvis was performed using the standard protocol following bolus administration of intravenous contrast. CONTRAST:  ISOVUE-300 IOPAMIDOL (ISOVUE-300) INJECTION 61% COMPARISON:  CT abdomen and pelvis April 06, 2017 FINDINGS: LOWER CHEST: Lung bases are clear. Included heart size is normal. No pericardial effusion. HEPATOBILIARY: The liver is diffusely hypodense compatible with steatosis. Contracted gallbladder. PANCREAS: Normal. SPLEEN: Normal. ADRENALS/URINARY TRACT: Kidneys are orthotopic, demonstrating symmetric enhancement. No nephrolithiasis, hydronephrosis or solid renal masses. The unopacified ureters are normal in course and caliber. Urinary bladder is well distended and unremarkable. Normal adrenal glands. STOMACH/BOWEL: The stomach, small and large bowel are normal in course and caliber without inflammatory changes. Moderate colonic diverticulosis. Normal appendix. VASCULAR/LYMPHATIC:  Aortoiliac vessels are normal in course and caliber. No lymphadenopathy by CT size criteria. REPRODUCTIVE: Normal. OTHER: No intraperitoneal free fluid or free air. MUSCULOSKELETAL: Nonacute. IMPRESSION: 1. Colonic diverticulosis without acute diverticulitis nor acute intra-abdominal/pelvic process. 2. Hepatic steatosis. Electronically Signed   By: Awilda Metro M.D.   On: 06/01/2017 04:18    ____________________________________________   PROCEDURES  Procedure(s) performed: None  Procedures  Critical Care performed: No  ____________________________________________   INITIAL IMPRESSION / ASSESSMENT AND PLAN / ED COURSE  As part of my medical decision making, I reviewed the  following data within the electronic MEDICAL RECORD NUMBER Notes from prior ED visits and [place] Controlled Substance Database   This is a 37 year old male who comes into the hospital today stating that he is going through withdrawals.  He was found on the floor by his wife at home as though he had passed out.   My differential diagnosis includes head injury, influenza, Norovirus, dehydration  The patient did receive a liter of normal saline when he initially arrived.  We checked his blood work and he did have an anion gap of 19.  I checked a CT scan of his head, abdomen and pelvis and they were negative.  Given the facial droop I will send the patient for a MRI of his brain.  I will reassess the patient once he is returned.  The patient did receive some Zofran for nausea.  I will also give him some fluids by mouth to see if he is able to keep anything down.  He will be reassessed.     CT scan of his head and abdomen and pelvis were negative.  The patient's MRI showed some white matter change advanced for the patient's age but no acute disease.  The patient was able to drink some fluids while in the emergency department.  He will be given some resources by [facility] and he will be discharged home. The patient was not hypoxic  during my reassessment.  ____________________________________________   FINAL CLINICAL IMPRESSION(S) / ED DIAGNOSES  Final diagnoses:  Dehydration  Nausea vomiting and diarrhea  Alcohol withdrawal syndrome without complication [facility](HCC)     ED Discharge Orders        Ordered    ondansetron (ZOFRAN ODT) 4 MG disintegrating tablet  Every 8 hours PRN     06/01/17 0810    chlordiazePOXIDE (LIBRIUM) 25 MG capsule     06/01/17 0818       Note:  This document was prepared using Dragon voice recognition software and may include unintentional dictation errors.    Rebecka ApleyWebster, Nigil Braman P, MD 06/01/17 [phone]

## 2017-06-01 NOTE — ED Notes (Signed)
Pt does not want family aware of the alcohol he has consumed. EDP made aware.

## 2017-06-01 NOTE — Discharge Instructions (Signed)
You are found to be intoxicated, your blood sugar was also slightly low and you are given food.  Make sure you are eating every several hours.  Return to emerge department immediately for any worsening condition including confusion or altered mental status, seizure, or any other symptoms concerning to you.

## 2017-06-01 NOTE — ED Notes (Signed)
Gave pt food tray, will recheck blood sugar in an hour

## 2017-06-01 NOTE — ED Triage Notes (Addendum)
Pt presents to ED with c/o dizziness weakness and n/v/d for the past 2 weeks. Has been evaluated by his pcp 2 times and was told he had Norovirus. Pt states he has been trying to wean himself off alcohol for the past 1 1/2 weeks.   Last shot of ETOH was 2 hours ago. Pt drinks approx 4-5 "shots" of liquor a day.

## 2018-06-28 ENCOUNTER — Inpatient Hospital Stay
Admission: EM | Admit: 2018-06-28 | Discharge: 2018-06-30 | DRG: 894 | Discharge status: Against Medical Advice | Payer: Self-pay | Attending: Internal Medicine | Admitting: Internal Medicine

## 2018-06-28 ENCOUNTER — Other Ambulatory Visit: Payer: Self-pay

## 2018-06-28 ENCOUNTER — Encounter: Payer: Self-pay | Admitting: Emergency Medicine

## 2018-06-28 ENCOUNTER — Emergency Department: Payer: Self-pay

## 2018-06-28 DIAGNOSIS — F1290 Cannabis use, unspecified, uncomplicated: Secondary | ICD-10-CM | POA: Diagnosis present

## 2018-06-28 DIAGNOSIS — F10231 Alcohol dependence with withdrawal delirium: Principal | ICD-10-CM | POA: Diagnosis present

## 2018-06-28 DIAGNOSIS — E875 Hyperkalemia: Secondary | ICD-10-CM | POA: Diagnosis present

## 2018-06-28 DIAGNOSIS — M109 Gout, unspecified: Secondary | ICD-10-CM | POA: Diagnosis present

## 2018-06-28 DIAGNOSIS — E860 Dehydration: Secondary | ICD-10-CM | POA: Diagnosis present

## 2018-06-28 DIAGNOSIS — K760 Fatty (change of) liver, not elsewhere classified: Secondary | ICD-10-CM | POA: Diagnosis present

## 2018-06-28 DIAGNOSIS — E876 Hypokalemia: Secondary | ICD-10-CM | POA: Diagnosis present

## 2018-06-28 DIAGNOSIS — N179 Acute kidney failure, unspecified: Secondary | ICD-10-CM | POA: Diagnosis present

## 2018-06-28 DIAGNOSIS — F10239 Alcohol dependence with withdrawal, unspecified: Secondary | ICD-10-CM

## 2018-06-28 DIAGNOSIS — F17210 Nicotine dependence, cigarettes, uncomplicated: Secondary | ICD-10-CM | POA: Diagnosis present

## 2018-06-28 DIAGNOSIS — E872 Acidosis: Secondary | ICD-10-CM | POA: Diagnosis present

## 2018-06-28 DIAGNOSIS — Z8249 Family history of ischemic heart disease and other diseases of the circulatory system: Secondary | ICD-10-CM

## 2018-06-28 DIAGNOSIS — F10939 Alcohol use, unspecified with withdrawal, unspecified: Secondary | ICD-10-CM | POA: Diagnosis present

## 2018-06-28 DIAGNOSIS — D6959 Other secondary thrombocytopenia: Secondary | ICD-10-CM | POA: Diagnosis present

## 2018-06-28 DIAGNOSIS — Z833 Family history of diabetes mellitus: Secondary | ICD-10-CM

## 2018-06-28 DIAGNOSIS — K589 Irritable bowel syndrome without diarrhea: Secondary | ICD-10-CM | POA: Diagnosis present

## 2018-06-28 DIAGNOSIS — I10 Essential (primary) hypertension: Secondary | ICD-10-CM | POA: Diagnosis present

## 2018-06-28 DIAGNOSIS — Z79899 Other long term (current) drug therapy: Secondary | ICD-10-CM

## 2018-06-28 HISTORY — DX: Alcohol abuse, uncomplicated: F10.10

## 2018-06-28 LAB — BASIC METABOLIC PANEL
BUN: 17 mg/dL (ref 6–20)
BUN: 19 mg/dL (ref 6–20)
CO2: <7 mmol/L — ABNORMAL LOW (ref 22–32)
Calcium: 7.2 mg/dL — ABNORMAL LOW (ref 8.9–10.3)
Calcium: 7.3 mg/dL — ABNORMAL LOW (ref 8.9–10.3)
Chloride: 105 mmol/L (ref 98–111)
Chloride: 106 mmol/L (ref 98–111)
Creatinine, Ser: 1.69 mg/dL — ABNORMAL HIGH (ref 0.61–1.24)
Creatinine, Ser: 1.69 mg/dL — ABNORMAL HIGH (ref 0.61–1.24)
GFR calc Af Amer: 59 mL/min — ABNORMAL LOW (ref >60)
GFR calc Af Amer: 59 mL/min — ABNORMAL LOW (ref >60)
GFR calc non Af Amer: 51 mL/min — ABNORMAL LOW (ref >60)
GFR calc non Af Amer: 51 mL/min — ABNORMAL LOW (ref >60)
Glucose, Bld: 162 mg/dL — ABNORMAL HIGH (ref 70–99)
Glucose, Bld: 169 mg/dL — ABNORMAL HIGH (ref 70–99)
Potassium: 7.1 mmol/L (ref 3.5–5.1)
Potassium: 7.1 mmol/L (ref 3.5–5.1)
SODIUM: 134 mmol/L — AB (ref 135–145)
Sodium: 133 mmol/L — ABNORMAL LOW (ref 135–145)

## 2018-06-28 LAB — COMPREHENSIVE METABOLIC PANEL
ALK PHOS: 64 U/L (ref 38–126)
ALT: 61 U/L — AB (ref 0–44)
AST: 193 U/L — AB (ref 15–41)
Albumin: 5.9 g/dL — ABNORMAL HIGH (ref 3.5–5.0)
BUN: 13 mg/dL (ref 6–20)
CALCIUM: 8.5 mg/dL — AB (ref 8.9–10.3)
CHLORIDE: 101 mmol/L (ref 98–111)
CO2: <7 mmol/L — ABNORMAL LOW (ref 22–32)
CREATININE: 1.24 mg/dL (ref 0.61–1.24)
GFR calc non Af Amer: >60 mL/min (ref >60)
GLUCOSE: 96 mg/dL (ref 70–99)
Potassium: 5.6 mmol/L — ABNORMAL HIGH (ref 3.5–5.1)
SODIUM: 135 mmol/L (ref 135–145)
Total Bilirubin: 1.7 mg/dL — ABNORMAL HIGH (ref 0.3–1.2)
Total Protein: 9.6 g/dL — ABNORMAL HIGH (ref 6.5–8.1)

## 2018-06-28 LAB — URINE DRUG SCREEN, QUALITATIVE (ARMC ONLY)
Amphetamines, Ur Screen: NOT DETECTED
Barbiturates, Ur Screen: NOT DETECTED
Benzodiazepine, Ur Scrn: NOT DETECTED
CANNABINOID 50 NG, UR ~~LOC~~: NOT DETECTED
COCAINE METABOLITE, UR ~~LOC~~: NOT DETECTED
MDMA (ECSTASY) UR SCREEN: NOT DETECTED
Methadone Scn, Ur: NOT DETECTED
OPIATE, UR SCREEN: NOT DETECTED
PHENCYCLIDINE (PCP) UR S: NOT DETECTED
Tricyclic, Ur Screen: NOT DETECTED

## 2018-06-28 LAB — CBC
HCT: 49.2 % (ref 39.0–52.0)
HEMOGLOBIN: 16.2 g/dL (ref 13.0–17.0)
MCH: 35.7 pg — AB (ref 26.0–34.0)
MCHC: 32.9 g/dL (ref 30.0–36.0)
MCV: 108.4 fL — ABNORMAL HIGH (ref 80.0–100.0)
NRBC: 0 % (ref 0.0–0.2)
PLATELETS: 135 10*3/uL — AB (ref 150–400)
RBC: 4.54 MIL/uL (ref 4.22–5.81)
RDW: 15 % (ref 11.5–15.5)
WBC: 10.5 10*3/uL (ref 4.0–10.5)

## 2018-06-28 LAB — GLUCOSE, CAPILLARY: GLUCOSE-CAPILLARY: 106 mg/dL — AB (ref 70–99)

## 2018-06-28 LAB — TROPONIN I: Troponin I: <0.03 ng/mL (ref <0.03)

## 2018-06-28 LAB — POTASSIUM: Potassium: 4.9 mmol/L (ref 3.5–5.1)

## 2018-06-28 LAB — LACTIC ACID, PLASMA
Lactic Acid, Venous: 3.4 mmol/L (ref 0.5–1.9)
Lactic Acid, Venous: 1.7 mmol/L (ref 0.5–1.9)

## 2018-06-28 LAB — MAGNESIUM: Magnesium: 1.9 mg/dL (ref 1.7–2.4)

## 2018-06-28 LAB — TSH: TSH: 1.549 u[IU]/mL (ref 0.350–4.500)

## 2018-06-28 LAB — ETHANOL: Alcohol, Ethyl (B): 67 mg/dL — ABNORMAL HIGH (ref <10)

## 2018-06-28 LAB — LIPASE, BLOOD: LIPASE: 112 U/L — AB (ref 11–51)

## 2018-06-28 MED ORDER — FOLIC ACID 1 MG PO TABS
1.0000 mg | ORAL_TABLET | Freq: Every day | ORAL | Status: DC
  Administered 2018-06-28 – 2018-06-30 (×3): 1 mg via ORAL
  Filled 2018-06-28 (×3): qty 1

## 2018-06-28 MED ORDER — THIAMINE HCL 100 MG/ML IJ SOLN: 100.0000 mg | Freq: Every day | INTRAMUSCULAR | Status: DC

## 2018-06-28 MED ORDER — ONDANSETRON HCL 4 MG/2ML IJ SOLN
4.0000 mg | Freq: Once | INTRAMUSCULAR | Status: AC
  Administered 2018-06-28: 4 mg via INTRAVENOUS
  Filled 2018-06-28: qty 2

## 2018-06-28 MED ORDER — LORAZEPAM 2 MG PO TABS
0.0000 mg | ORAL_TABLET | Freq: Two times a day (BID) | ORAL | Status: DC
  Administered 2018-06-30: 2 mg via ORAL
  Filled 2018-06-28: qty 1

## 2018-06-28 MED ORDER — ONDANSETRON HCL 4 MG PO TABS
4.0000 mg | ORAL_TABLET | Freq: Four times a day (QID) | ORAL | Status: DC | PRN
  Administered 2018-06-29: 4 mg via ORAL
  Filled 2018-06-28: qty 1

## 2018-06-28 MED ORDER — DEXTROSE 50 % IV SOLN
25.0000 mL | Freq: Once | INTRAVENOUS | Status: AC
  Administered 2018-06-28: 25 mL via INTRAVENOUS
  Filled 2018-06-28: qty 50

## 2018-06-28 MED ORDER — PANTOPRAZOLE SODIUM 40 MG PO TBEC
40.0000 mg | DELAYED_RELEASE_TABLET | Freq: Every day | ORAL | Status: DC
  Administered 2018-06-28 – 2018-06-30 (×3): 40 mg via ORAL
  Filled 2018-06-28 (×3): qty 1

## 2018-06-28 MED ORDER — PATIROMER SORBITEX CALCIUM 8.4 G PO PACK
8.4000 g | PACK | Freq: Every day | ORAL | Status: DC
  Filled 2018-06-28: qty 1

## 2018-06-28 MED ORDER — LORAZEPAM 2 MG/ML IJ SOLN
1.0000 mg | Freq: Once | INTRAMUSCULAR | Status: AC
  Administered 2018-06-28: 1 mg via INTRAVENOUS
  Filled 2018-06-28: qty 1

## 2018-06-28 MED ORDER — LORAZEPAM 2 MG/ML IJ SOLN: 0.0000 mg | Freq: Two times a day (BID) | INTRAMUSCULAR | Status: DC

## 2018-06-28 MED ORDER — SODIUM CHLORIDE 0.9% FLUSH
3.0000 mL | Freq: Two times a day (BID) | INTRAVENOUS | Status: DC
  Administered 2018-06-28 – 2018-06-30 (×4): 3 mL via INTRAVENOUS

## 2018-06-28 MED ORDER — VITAMIN B-1 100 MG PO TABS
100.0000 mg | ORAL_TABLET | Freq: Every day | ORAL | Status: DC
  Administered 2018-06-28 – 2018-06-30 (×3): 100 mg via ORAL
  Filled 2018-06-28 (×3): qty 1

## 2018-06-28 MED ORDER — ALBUTEROL SULFATE (2.5 MG/3ML) 0.083% IN NEBU
2.5000 mg | INHALATION_SOLUTION | RESPIRATORY_TRACT | Status: AC
  Administered 2018-06-28: 2.5 mg via RESPIRATORY_TRACT
  Filled 2018-06-28: qty 3

## 2018-06-28 MED ORDER — ACETAMINOPHEN 325 MG PO TABS
650.0000 mg | ORAL_TABLET | Freq: Four times a day (QID) | ORAL | Status: DC | PRN
  Administered 2018-06-28: 650 mg via ORAL
  Filled 2018-06-28: qty 2

## 2018-06-28 MED ORDER — ACETAMINOPHEN 650 MG RE SUPP: 650.0000 mg | Freq: Four times a day (QID) | RECTAL | Status: DC | PRN

## 2018-06-28 MED ORDER — INSULIN ASPART 100 UNIT/ML ~~LOC~~ SOLN: 10.0000 [IU] | Freq: Once | SUBCUTANEOUS | Status: DC

## 2018-06-28 MED ORDER — INSULIN ASPART 100 UNIT/ML IV SOLN
10.0000 [IU] | Freq: Once | INTRAVENOUS | Status: AC
  Administered 2018-06-28: 10 [IU] via INTRAVENOUS
  Filled 2018-06-28: qty 0.1

## 2018-06-28 MED ORDER — ENOXAPARIN SODIUM 40 MG/0.4ML ~~LOC~~ SOLN
40.0000 mg | SUBCUTANEOUS | Status: DC
  Administered 2018-06-28 – 2018-06-29 (×2): 40 mg via SUBCUTANEOUS
  Filled 2018-06-28 (×2): qty 0.4

## 2018-06-28 MED ORDER — SENNA 8.6 MG PO TABS
1.0000 | ORAL_TABLET | Freq: Two times a day (BID) | ORAL | Status: DC
  Administered 2018-06-28: 8.6 mg via ORAL
  Filled 2018-06-28 (×2): qty 1

## 2018-06-28 MED ORDER — SODIUM BICARBONATE 8.4 % IV SOLN
INTRAVENOUS | Status: DC
  Administered 2018-06-28 – 2018-06-29 (×2): via INTRAVENOUS
  Filled 2018-06-28 (×3): qty 150

## 2018-06-28 MED ORDER — OXYCODONE HCL 5 MG PO TABS
5.0000 mg | ORAL_TABLET | ORAL | Status: DC | PRN
  Administered 2018-06-28 (×2): 5 mg via ORAL
  Filled 2018-06-28 (×2): qty 1

## 2018-06-28 MED ORDER — LORAZEPAM 2 MG PO TABS: 0.0000 mg | ORAL_TABLET | Freq: Two times a day (BID) | ORAL | Status: DC

## 2018-06-28 MED ORDER — LORAZEPAM 2 MG PO TABS
0.0000 mg | ORAL_TABLET | Freq: Four times a day (QID) | ORAL | Status: AC
  Administered 2018-06-28 – 2018-06-29 (×2): 2 mg via ORAL
  Administered 2018-06-29 (×3): 1 mg via ORAL
  Administered 2018-06-30: 2 mg via ORAL
  Filled 2018-06-28 (×6): qty 1

## 2018-06-28 MED ORDER — LORAZEPAM 2 MG PO TABS: 0.0000 mg | ORAL_TABLET | Freq: Four times a day (QID) | ORAL | Status: DC

## 2018-06-28 MED ORDER — LORAZEPAM 2 MG/ML IJ SOLN
0.0000 mg | Freq: Four times a day (QID) | INTRAMUSCULAR | Status: DC
  Administered 2018-06-28: 2 mg via INTRAVENOUS
  Filled 2018-06-28: qty 1

## 2018-06-28 MED ORDER — CALCIUM GLUCONATE-NACL 1-0.675 GM/50ML-% IV SOLN
1.0000 g | Freq: Once | INTRAVENOUS | Status: AC
  Administered 2018-06-28: 1000 mg via INTRAVENOUS
  Filled 2018-06-28 (×2): qty 50

## 2018-06-28 MED ORDER — LABETALOL HCL 5 MG/ML IV SOLN: 10.0000 mg | INTRAVENOUS | Status: DC | PRN

## 2018-06-28 MED ORDER — SODIUM CHLORIDE 0.9 % IV BOLUS
1000.0000 mL | Freq: Once | INTRAVENOUS | Status: AC
  Administered 2018-06-28: 1000 mL via INTRAVENOUS

## 2018-06-28 MED ORDER — SODIUM CHLORIDE 0.9 % IV BOLUS
2000.0000 mL | Freq: Once | INTRAVENOUS | Status: AC
  Administered 2018-06-28: 2000 mL via INTRAVENOUS

## 2018-06-28 MED ORDER — CHLORDIAZEPOXIDE HCL 25 MG PO CAPS
25.0000 mg | ORAL_CAPSULE | Freq: Three times a day (TID) | ORAL | Status: DC
  Administered 2018-06-28 – 2018-06-30 (×7): 25 mg via ORAL
  Filled 2018-06-28 (×7): qty 1

## 2018-06-28 MED ORDER — POLYETHYLENE GLYCOL 3350 17 G PO PACK: 17.0000 g | PACK | Freq: Every day | ORAL | Status: DC | PRN

## 2018-06-28 MED ORDER — LORAZEPAM 2 MG/ML IJ SOLN: 0.0000 mg | Freq: Four times a day (QID) | INTRAMUSCULAR | Status: AC

## 2018-06-28 MED ORDER — SODIUM POLYSTYRENE SULFONATE 15 GM/60ML PO SUSP: 60.0000 g | Freq: Once | ORAL | Status: DC

## 2018-06-28 MED ORDER — ALBUTEROL SULFATE (2.5 MG/3ML) 0.083% IN NEBU
2.5000 mg | INHALATION_SOLUTION | RESPIRATORY_TRACT | Status: DC | PRN
  Administered 2018-06-28: 2.5 mg via RESPIRATORY_TRACT
  Filled 2018-06-28: qty 3

## 2018-06-28 MED ORDER — SODIUM POLYSTYRENE SULFONATE 15 GM/60ML PO SUSP
60.0000 g | Freq: Once | ORAL | Status: AC
  Administered 2018-06-28: 60 g via ORAL
  Filled 2018-06-28: qty 240

## 2018-06-28 MED ORDER — METOPROLOL TARTRATE 50 MG PO TABS
50.0000 mg | ORAL_TABLET | Freq: Two times a day (BID) | ORAL | Status: DC
  Administered 2018-06-28 – 2018-06-30 (×5): 50 mg via ORAL
  Filled 2018-06-28 (×5): qty 1

## 2018-06-28 MED ORDER — ONDANSETRON HCL 4 MG/2ML IJ SOLN
4.0000 mg | Freq: Four times a day (QID) | INTRAMUSCULAR | Status: DC | PRN
  Administered 2018-06-28: 4 mg via INTRAVENOUS
  Filled 2018-06-28: qty 2

## 2018-06-28 NOTE — ED Notes (Signed)
Pt up to toilet, hand hygiene completed and pt given food tray from dietary and ice water at this time.

## 2018-06-28 NOTE — ED Triage Notes (Signed)
Pt to ED via POV c/o emesis x 8 hours. Pt states that he is having trouble breathing and trouble seeing. Pt is able to speak in complete sentences at this time.

## 2018-06-28 NOTE — ED Notes (Signed)
EDP to bedside; updated plan of care provided. 

## 2018-06-28 NOTE — Progress Notes (Signed)
Hyperkalemia due to AKI  Dextrose, Insulin IV, Calcium, Albuerol, Kayexalate, Bicarb drip ordered. EKG STAT.  Repeat BMP ordered.

## 2018-06-28 NOTE — Progress Notes (Signed)
Talked to Dr. Elpidio Anis about patient's scheduled Ativan if I can re-time it to 1800 as it is close to his Librium dose and patient is already drowsy, order to retime.   Also notify about his potassium at 7, waiting for MD to call back. RN will continue to monitor.

## 2018-06-28 NOTE — ED Notes (Signed)
ED TO INPATIENT HANDOFF REPORT  ED Nurse Name and Phone #: Shai Rasmussen [phone]  S Name/Age/Gender Paul Miles 38 y.o. male Room/Bed: ED18A/ED18A  Code Status   Code Status: Full Code  Home/SNF/Other Home Patient oriented x 4 with some confusion occasionally  Is this baseline? no  Triage Complete: Triage complete  Chief Complaint vomiting  Triage Note Pt to ED via POV c/o emesis x 8 hours. Pt states that he is having trouble breathing and trouble seeing. Pt is able to speak in complete sentences at this time.    Allergies No Known Allergies  Level of Care/Admitting Diagnosis ED Disposition    ED Disposition Condition Comment   Admit  Hospital Area: [facility] [[id number]]  Level of Care: Telemetry [5]  Diagnosis: Alcohol withdrawal (HCC) [291.81.ICD-9-CM]  Admitting Physician: Milagros Loll [[id number]]  Attending Physician: Milagros Loll [[id number]]  Estimated length of stay: past midnight tomorrow  Certification:: I certify this patient will need inpatient services for at least 2 midnights  PT Class (Do Not Modify): Inpatient [101]  PT Acc Code (Do Not Modify): Private [1]       B Medical/Surgery History Past Medical History:  Diagnosis Date  . Alcohol abuse   . Asthma   . Gout   . IBS (irritable bowel syndrome)   . Tremor    History reviewed. No pertinent surgical history.   A IV Location/Drains/Wounds Patient Lines/Drains/Airways Status   Active Line/Drains/Airways    Name:   Placement date:   Placement time:   Site:   Days:   Peripheral IV 06/01/17 Left Antecubital   06/01/17    -    Antecubital   392   Peripheral IV 06/28/18 Right Hand   06/28/18    0808    Hand   less than 1          Intake/Output Last 24 hours No intake or output data in the 24 hours ending 06/28/18 1442  Labs/Imaging Results for orders placed or performed during the hospital encounter of 06/28/18 (from the past 48 hour(s))  Glucose, capillary     Status:  Abnormal   Collection Time: 06/28/18  7:59 AM  Result Value Ref Range   Glucose-Capillary 106 (H) 70 - 99 mg/dL  CBC     Status: Abnormal   Collection Time: 06/28/18  8:03 AM  Result Value Ref Range   WBC 10.5 4.0 - 10.5 K/uL   RBC 4.54 4.22 - 5.81 MIL/uL   Hemoglobin 16.2 13.0 - 17.0 g/dL   HCT 13.0 86.5 - 78.4 %   MCV 108.4 (H) 80.0 - 100.0 fL   MCH 35.7 (H) 26.0 - 34.0 pg   MCHC 32.9 30.0 - 36.0 g/dL   RDW 69.6 29.5 - 28.4 %   Platelets 135 (L) 150 - 400 K/uL   nRBC 0.0 0.0 - 0.2 %    Comment: Performed at [facility], [address] [place], [place] [address]  Comprehensive metabolic panel     Status: Abnormal   Collection Time: 06/28/18  8:03 AM  Result Value Ref Range   Sodium 135 135 - 145 mmol/L   Potassium 5.6 (H) 3.5 - 5.1 mmol/L   Chloride 101 98 - 111 mmol/L   CO2 <7 (L) 22 - 32 mmol/L   Glucose, Bld 96 70 - 99 mg/dL   BUN 13 6 - 20 mg/dL   Creatinine, Ser 0.10 0.61 - 1.24 mg/dL   Calcium 8.5 (L) 8.9 - 10.3 mg/dL  Total Protein 9.6 (H) 6.5 - 8.1 g/dL   Albumin 5.9 (H) 3.5 - 5.0 g/dL   AST 201 (H) 15 - 41 U/L   ALT 61 (H) 0 - 44 U/L   Alkaline Phosphatase 64 38 - 126 U/L   Total Bilirubin 1.7 (H) 0.3 - 1.2 mg/dL   GFR calc non Af Amer >60 >60 mL/min   GFR calc Af Amer >60 >60 mL/min   Anion gap NOT CALCULATED 5 - 15    Comment: Performed at [facility], [address] [place], [place] [address]  Troponin I - ONCE - STAT     Status: None   Collection Time: 06/28/18  8:03 AM  Result Value Ref Range   Troponin I <0.03 <0.03 ng/mL    Comment: Performed at [facility], [address] [place], [place] [address]  Lipase, blood     Status: Abnormal   Collection Time: 06/28/18  8:03 AM  Result Value Ref Range   Lipase 112 (H) 11 - 51 U/L    Comment: Performed at [facility], [address] [place], [place] [address]  Ethanol     Status: Abnormal   Collection Time: 06/28/18  8:03 AM  Result Value Ref Range    Alcohol, Ethyl (B) 67 (H) <10 mg/dL    Comment: (NOTE) Lowest detectable limit for serum alcohol is 10 mg/dL. For medical purposes only. Performed at [facility], [address] [place], [place] [address]   Urine Drug Screen, Qualitative [facility] only)     Status: None   Collection Time: 06/28/18  8:55 AM  Result Value Ref Range   Tricyclic, Ur Screen NONE DETECTED NONE DETECTED   Amphetamines, Ur Screen NONE DETECTED NONE DETECTED   MDMA (Ecstasy)Ur Screen NONE DETECTED NONE DETECTED   Cocaine Metabolite,Ur Calvin NONE DETECTED NONE DETECTED   Opiate, Ur Screen NONE DETECTED NONE DETECTED   Phencyclidine (PCP) Ur S NONE DETECTED NONE DETECTED   Cannabinoid 50 Ng, Ur [place] NONE DETECTED NONE DETECTED   Barbiturates, Ur Screen NONE DETECTED NONE DETECTED   Benzodiazepine, Ur Scrn NONE DETECTED NONE DETECTED   Methadone Scn, Ur NONE DETECTED NONE DETECTED    Comment: (NOTE) Tricyclics + metabolites, urine    Cutoff 1000 ng/mL Amphetamines + metabolites, urine  Cutoff 1000 ng/mL MDMA (Ecstasy), urine              Cutoff 500 ng/mL Cocaine Metabolite, urine          Cutoff 300 ng/mL Opiate + metabolites, urine        Cutoff 300 ng/mL Phencyclidine (PCP), urine         Cutoff 25 ng/mL Cannabinoid, urine                 Cutoff 50 ng/mL Barbiturates + metabolites, urine  Cutoff 200 ng/mL Benzodiazepine, urine              Cutoff 200 ng/mL Methadone, urine                   Cutoff 300 ng/mL The urine drug screen provides only a preliminary, unconfirmed analytical test result and should not be used for non-medical purposes. Clinical consideration and professional judgment should be applied to any positive drug screen result due to possible interfering substances. A more specific alternate chemical method must be used in order to obtain a confirmed analytical result. Gas chromatography / mass spectrometry (GC/MS) is the preferred confirmat ory method. Performed at  [facility], [address] [place], [place] [address]   Lactic acid, plasma     Status: Abnormal   Collection Time: 06/28/18 11:35 AM  Result Value Ref Range   Lactic Acid, Venous 3.4 (HH) 0.5 - 1.9 mmol/L    Comment: CRITICAL RESULT CALLED TO, READ BACK BY AND VERIFIED WITH Jearl Soto RN 06/28/2018 @ 1225 RDW/KLW Performed at [facility], [address] [place], [place] [address]   TSH     Status: None   Collection Time: 06/28/18 11:35 AM  Result Value Ref Range   TSH 1.549 0.350 - 4.500 uIU/mL    Comment: Performed by a 3rd Generation assay with a functional sensitivity of <=0.01 uIU/mL. Performed at [facility], [address] [place], [place] [address]   Magnesium     Status: None   Collection Time: 06/28/18 11:35 AM  Result Value Ref Range   Magnesium 1.9 1.7 - 2.4 mg/dL    Comment: Performed at [facility], [address] [place], [place] [address]   Dg Chest Portable 1 View  Result Date: 06/28/2018 CLINICAL DATA:  Shortness of breath. EXAM: PORTABLE CHEST 1 VIEW COMPARISON:  Radiographs of April 06, 2017. FINDINGS: The heart size and mediastinal contours are within normal limits. Both lungs are clear. No pneumothorax or pleural effusion is noted. The visualized skeletal structures are unremarkable. IMPRESSION: No active disease. Electronically Signed   By: Lupita Raider, M.D.   On: 06/28/2018 08:44    Pending Labs Unresulted Labs (From admission, onward)    Start     Ordered   07/05/18 0500  Creatinine, serum  (enoxaparin (LOVENOX)    CrCl >/= 30 ml/min)  Weekly,   STAT    Comments:  while on enoxaparin therapy    06/28/18 0953   06/29/18 0500  Comprehensive metabolic panel  Tomorrow morning,   STAT     06/28/18 0953   06/29/18 0500  CBC  Tomorrow morning,   STAT     06/28/18 0953   06/29/18 0500  Protime-INR  Tomorrow morning,   STAT     06/28/18 0953   06/28/18 1400  Basic metabolic panel  Once-Timed,   STAT     06/28/18 0955    06/28/18 1400  Lactic acid, plasma  Once-Timed,   STAT     06/28/18 1324   06/28/18 0954  HIV antibody (Routine Testing)  Add-on,   AD     06/28/18 0953          Vitals/Pain Today's Vitals   06/28/18 1130 06/28/18 1230 06/28/18 1315 06/28/18 1322  BP:  (!) 151/94 (!) 139/99   Pulse:  (!) 123 (!) 119   Resp:      Temp:      TempSrc:      SpO2:  100%    Weight:      Height:      PainSc: 4    8     Isolation Precautions No active isolations  Medications Medications  LORazepam (ATIVAN) injection 0-4 mg (2 mg Intravenous Given 06/28/18 1040)    Or  LORazepam (ATIVAN) tablet 0-4 mg ( Oral See Alternative 06/28/18 1040)  LORazepam (ATIVAN) injection 0-4 mg (has no administration in time range)    Or  LORazepam (ATIVAN) tablet 0-4 mg (has no administration in time range)  thiamine (VITAMIN B-1) tablet 100 mg (100 mg Oral Given 06/28/18 1031)    Or  thiamine (B-1) injection 100 mg (  Intravenous See Alternative 06/28/18 1031)  folic acid (FOLVITE) tablet 1 mg (1 mg Oral Given 06/28/18 1030)  oxyCODONE (Oxy IR/ROXICODONE) immediate release tablet 5 mg (5 mg Oral Given 06/28/18 1030)  labetalol (NORMODYNE,TRANDATE) injection 10 mg (has no administration in time range)  enoxaparin (LOVENOX) injection 40 mg (40 mg Subcutaneous Given 06/28/18 1032)  sodium chloride flush (NS) 0.9 % injection 3 mL (3 mLs Intravenous Not Given 06/28/18 1032)  acetaminophen (TYLENOL) tablet 650 mg (650 mg Oral Given 06/28/18 1335)    Or  acetaminophen (TYLENOL) suppository 650 mg ( Rectal See Alternative 06/28/18 1335)  polyethylene glycol (MIRALAX / GLYCOLAX) packet 17 g (has no administration in time range)  senna (SENOKOT) tablet 8.6 mg (8.6 mg Oral Given 06/28/18 1030)  ondansetron (ZOFRAN) tablet 4 mg (has no administration in time range)    Or  ondansetron (ZOFRAN) injection 4 mg (has no administration in time range)  albuterol (PROVENTIL) (2.5 MG/3ML) 0.083% nebulizer solution 2.5 mg (has no  administration in time range)  metoprolol tartrate (LOPRESSOR) tablet 50 mg (50 mg Oral Given 06/28/18 1031)  pantoprazole (PROTONIX) EC tablet 40 mg (40 mg Oral Given 06/28/18 1031)  chlordiazePOXIDE (LIBRIUM) capsule 25 mg (25 mg Oral Given 06/28/18 1030)  LORazepam (ATIVAN) injection 1 mg (1 mg Intravenous Given 06/28/18 0809)  ondansetron (ZOFRAN) injection 4 mg (4 mg Intravenous Given 06/28/18 0808)  sodium chloride 0.9 % bolus 1,000 mL (1,000 mLs Intravenous New Bag/Given 06/28/18 0808)  LORazepam (ATIVAN) injection 1 mg (1 mg Intravenous Given 06/28/18 0923)  sodium chloride 0.9 % bolus 2,000 mL (2,000 mLs Intravenous New Bag/Given 06/28/18 1032)    Mobility High fall risk

## 2018-06-28 NOTE — ED Notes (Signed)
Hospitalist to bedside.

## 2018-06-28 NOTE — ED Notes (Signed)
Date and time results received: 06/28/18   Test: lactic Critical Value: 3.4  Name of Provider Notified: sudini  Orders Received? Or Actions Taken?: md notified through secure messaging

## 2018-06-28 NOTE — ED Provider Notes (Signed)
[facility] Emergency Department Provider Note   ____________________________________________    I have reviewed the triage vital signs and the nursing notes.   HISTORY  Chief Complaint Nausea and vomiting, alcohol withdrawal    HPI ROCZEN Miles is a 38 y.o. male with a history of alcohol abuse who reports he stopped drinking alcohol greater than 24 hours ago in an attempt to stop cold [place].  Apparently he has been having severe nausea and vomiting over the last 10 hours.  Tremor started about 14 hours ago.  No hallucinations.  Complains of diffuse abdominal cramping, weakness and some chest discomfort which he attributes to vomiting.  Has not taken any antiemetics  Past Medical History:  Diagnosis Date  . Alcohol abuse   . Asthma   . Gout   . IBS (irritable bowel syndrome)   . Tremor     Patient Active Problem List   Diagnosis Date Noted  . Gastroenteritis 04/06/2017    History reviewed. No pertinent surgical history.  Prior to Admission medications   Medication Sig Start Date End Date Taking? Authorizing Provider  albuterol (PROVENTIL HFA;VENTOLIN HFA) 108 (90 Base) MCG/ACT inhaler Inhale 2 puffs into the lungs every 6 (six) hours as needed for wheezing or shortness of breath.   Yes [provider]  multivitamin (ONE-A-DAY MEN'S) TABS tablet Take 1 tablet by mouth daily.   Yes [provider]  chlordiazePOXIDE (LIBRIUM) 25 MG capsule 50 mg 3 times a day x 3 days 50 mg 2 times a day for 3 days 50 mg once a day for 3 days stop Patient not taking: Reported on 06/01/2017 06/01/17   Paul Apley, MD  ondansetron (ZOFRAN ODT) 4 MG disintegrating tablet Take 1 tablet (4 mg total) by mouth every 8 (eight) hours as needed for nausea or vomiting. Patient not taking: Reported on 06/28/2018 04/08/17   Paul Baas, MD  ondansetron (ZOFRAN ODT) 4 MG disintegrating tablet Take 1 tablet (4 mg total) by mouth every 8 (eight)  hours as needed for nausea or vomiting. Patient not taking: Reported on 06/01/2017 06/01/17   Paul Apley, MD  pantoprazole (PROTONIX) 40 MG tablet Take 1 tablet (40 mg total) by mouth daily. 04/08/17   Paul Baas, MD     Allergies Patient has no known allergies.  Family History  Problem Relation Age of Onset  . Diabetes Mother   . CAD Mother     Social History Social History   Tobacco Use  . Smoking status: Current Every Day Smoker    Packs/day: 2.00    Types: Cigarettes  . Smokeless tobacco: Never Used  Substance Use Topics  . Alcohol use: Yes    Comment: pt stopped drinking 1 day ago  . Drug use: Yes    Types: Marijuana    Review of Systems  Constitutional: Tremors Eyes: No visual changes.  ENT: No sore throat. Cardiovascular: As above Respiratory: Mild cough Gastrointestinal: As above Genitourinary: Negative for dysuria. Musculoskeletal: Negative for back pain. Skin: Negative for rash. Neurological: Negative for headaches   ____________________________________________   PHYSICAL EXAM:  VITAL SIGNS: ED Triage Vitals  Enc Vitals Group     BP 06/28/18 0731 (!) 157/77     Pulse Rate 06/28/18 0731 (!) 146     Resp 06/28/18 0731 (!) 22     Temp 06/28/18 0731 97.8 F (36.6 C)     Temp Source 06/28/18 0731 Oral     SpO2 06/28/18 0731 99 %  Weight 06/28/18 0742 86.2 kg (190 lb)     Height 06/28/18 0742 1.829 m (6')     Head Circumference --      Peak Flow --      Pain Score 06/28/18 0742 10     Pain Loc --      Pain Edu? --      Excl. in GC? --     Constitutional: Alert and oriented.  Actively vomiting, tremulous Eyes: Conjunctivae are normal.  Head: Atraumatic. Nose: No congestion/rhinnorhea. Mouth/Throat: Mucous membranes are moist.    Cardiovascular: Significant tachycardia, regular rhythm. Grossly normal heart sounds.  Good peripheral circulation. Respiratory: Normal respiratory effort.  No retractions. Lungs CTAB.  Gastrointestinal: Soft, no significant tenderness, no distention  Musculoskeletal:   Warm and well perfused Neurologic:  Normal speech and language. No gross focal neurologic deficits are appreciated.  Skin:  Skin is warm, dry and intact. No rash noted. Psychiatric: Anxious speech normal  ____________________________________________   LABS (all labs ordered are listed, but only abnormal results are displayed)  Labs Reviewed  GLUCOSE, CAPILLARY - Abnormal; Notable for the following components:      Result Value   Glucose-Capillary 106 (*)    All other components within normal limits  CBC - Abnormal; Notable for the following components:   MCV 108.4 (*)    MCH 35.7 (*)    Platelets 135 (*)    All other components within normal limits  COMPREHENSIVE METABOLIC PANEL - Abnormal; Notable for the following components:   Potassium 5.6 (*)    CO2 <7 (*)    Calcium 8.5 (*)    Total Protein 9.6 (*)    Albumin 5.9 (*)    AST 193 (*)    ALT 61 (*)    Total Bilirubin 1.7 (*)    All other components within normal limits  LIPASE, BLOOD - Abnormal; Notable for the following components:   Lipase 112 (*)    All other components within normal limits  ETHANOL - Abnormal; Notable for the following components:   Alcohol, Ethyl (B) 67 (*)    All other components within normal limits  TROPONIN I  URINE DRUG SCREEN, QUALITATIVE ([facility] ONLY)   ____________________________________________  EKG  ED ECG REPORT I, Jene Everyobert Nicodemus Denk, the attending physician, personally viewed and interpreted this ECG.  Date: 06/28/2018  Rhythm: Sinus tachycardia QRS Axis: normal Intervals: normal ST/T Wave abnormalities: normal Narrative Interpretation: no evidence of acute ischemia  ____________________________________________  RADIOLOGY  Chest x-ray unremarkable ____________________________________________   PROCEDURES  Procedure(s) performed: No  Procedures   Critical Care performed: yes   CRITICAL CARE Performed by: Jene Everyobert Amor Hyle   Total critical care time: 30 minutes  Critical care time was exclusive of separately billable procedures and treating other patients.  Critical care was necessary to treat or prevent imminent or life-threatening deterioration.  Critical care was time spent personally by me on the following activities: development of treatment plan with patient and/or surrogate as well as nursing, discussions with consultants, evaluation of patient's response to treatment, examination of patient, obtaining history from patient or surrogate, ordering and performing treatments and interventions, ordering and review of laboratory studies, ordering and review of radiographic studies, pulse oximetry and re-evaluation of patient's condition.  ____________________________________________   INITIAL IMPRESSION / ASSESSMENT AND PLAN / ED COURSE  Pertinent labs & imaging results that were available during my care of the patient were reviewed by me and considered in my medical decision making (see chart for details).  Patient  presents with severe nausea and vomiting for approximately 10 hours, he is markedly tachycardic and is ill-appearing.  Symptoms are most likely related to severe alcohol withdrawal, I have ordered IV Ativan, IV fluids, IV Zofran, EKG labs patient has been placed on a cardiac monitor.  We will watch closely  Patient with mild improvement after initial IV Ativan, still significantly tachycardic, nausea has improved somewhat.  Electrolytes are reassuring.  Additional IV Ativan given  Patient will require admission for severe alcohol withdrawal.  Will initiate CIWA protocol    ____________________________________________   FINAL CLINICAL IMPRESSION(S) / ED DIAGNOSES  Final diagnoses:  Alcohol withdrawal syndrome with complication [facility])        Note:  This document was prepared using Dragon voice recognition software and may include unintentional  dictation errors.   Jene Every, MD 06/28/18 [phone]

## 2018-06-28 NOTE — ED Notes (Signed)
Pt states anxiety, headache, nausea have all decreased. Pt still confused and unable to do simple math, states in is currently Friday march 12. Shaking when attempting to move. 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

## 2018-06-28 NOTE — H&P (Signed)
[facility] - [place] at [facility]   PATIENT NAME: Paul Miles    MR#:  [id number]  DATE OF BIRTH:  1981-02-14  DATE OF ADMISSION:  06/28/2018  PRIMARY CARE PHYSICIAN: Jeannie Done, MD   REQUESTING/REFERRING PHYSICIAN: Dr. Cyril Loosen  CHIEF COMPLAINT:   Chief Complaint  Patient presents with  . Emesis  . Delirium Tremens (DTS)    HISTORY OF PRESENT ILLNESS:  Paul Miles  is a 38 y.o. male with a known history of alcohol abuse, asthma, tobacco use here with abd pain, nausea and not feeling well. His last drink was yesterday morning. He is very tremuluos inspite of ativan in ED. Received 1 liter IV bolus.  Wife is at bedside.  Patient drinks half of fifth-fifth of alcohol daily.  He works as a Community education officer. He had quit alcohol many months back and stayed away for 30 days.  PAST MEDICAL HISTORY:   Past Medical History:  Diagnosis Date  . Alcohol abuse   . Asthma   . Gout   . IBS (irritable bowel syndrome)   . Tremor     PAST SURGICAL HISTORY:  History reviewed. No pertinent surgical history.  SOCIAL HISTORY:   Social History   Tobacco Use  . Smoking status: Current Every Day Smoker    Packs/day: 2.00    Types: Cigarettes  . Smokeless tobacco: Never Used  Substance Use Topics  . Alcohol use: Yes    Comment: pt stopped drinking 1 day ago    FAMILY HISTORY:   Family History  Problem Relation Age of Onset  . Diabetes Mother   . CAD Mother     DRUG ALLERGIES:  No Known Allergies  REVIEW OF SYSTEMS:   Review of Systems  Constitutional: Positive for malaise/fatigue. Negative for chills and fever.  HENT: Negative for sore throat.   Eyes: Negative for blurred vision, double vision and pain.  Respiratory: Negative for cough, hemoptysis, shortness of breath and wheezing.   Cardiovascular: Negative for chest pain, palpitations, orthopnea and leg swelling.  Gastrointestinal: Positive for abdominal pain, constipation and nausea. Negative for  diarrhea, heartburn and vomiting.  Genitourinary: Negative for dysuria and hematuria.  Musculoskeletal: Negative for back pain and joint pain.  Skin: Negative for rash.  Neurological: Negative for sensory change, speech change, focal weakness and headaches.  Endo/Heme/Allergies: Does not bruise/bleed easily.  Psychiatric/Behavioral: Negative for depression. The patient is not nervous/anxious.     MEDICATIONS AT HOME:   Prior to Admission medications   Medication Sig Start Date End Date Taking? Authorizing Provider  albuterol (PROVENTIL HFA;VENTOLIN HFA) 108 (90 Base) MCG/ACT inhaler Inhale 2 puffs into the lungs every 6 (six) hours as needed for wheezing or shortness of breath.   Yes [provider]  multivitamin (ONE-A-DAY MEN'S) TABS tablet Take 1 tablet by mouth daily.   Yes [provider]  chlordiazePOXIDE (LIBRIUM) 25 MG capsule 50 mg 3 times a day x 3 days 50 mg 2 times a day for 3 days 50 mg once a day for 3 days stop Patient not taking: Reported on 06/01/2017 06/01/17   Rebecka Apley, MD  ondansetron (ZOFRAN ODT) 4 MG disintegrating tablet Take 1 tablet (4 mg total) by mouth every 8 (eight) hours as needed for nausea or vomiting. Patient not taking: Reported on 06/28/2018 04/08/17   Enid Baas, MD  ondansetron (ZOFRAN ODT) 4 MG disintegrating tablet Take 1 tablet (4 mg total) by mouth every 8 (eight) hours as needed for nausea or  vomiting. Patient not taking: Reported on 06/01/2017 06/01/17   Rebecka Apley, MD  pantoprazole (PROTONIX) 40 MG tablet Take 1 tablet (40 mg total) by mouth daily. 04/08/17   Enid Baas, MD     VITAL SIGNS:  Blood pressure (!) 172/101, pulse (!) 135, temperature 97.8 F (36.6 C), temperature source Oral, resp. rate (!) 26, height 6' (1.829 m), weight 86.2 kg, SpO2 100 %.  PHYSICAL EXAMINATION:  Physical Exam  GENERAL:  38 y.o.-year-old patient lying in the bed .  Tremulous, anxious. EYES: Pupils equal, round,  reactive to light and accommodation. No scleral icterus. Extraocular muscles intact.  HEENT: Head atraumatic, normocephalic. Oropharynx and nasopharynx clear. No oropharyngeal erythema, dry oral mucosa  NECK:  Supple, no jugular venous distention. No thyroid enlargement, no tenderness.  LUNGSTachypneic.  Clear to auscultation on both sides CARDIOVASCULAR: S1, S2 normal. No murmurs, rubs, or gallops.  ABDOMEN: Soft, nontender, nondistended. Bowel sounds present. No organomegaly or mass.  EXTREMITIES: No pedal edema, cyanosis, or clubbing. + 2 pedal & radial pulses b/l.   NEUROLOGIC: Cranial nerves II through XII are intact. No focal Motor or sensory deficits appreciated b/l.  Tremors PSYCHIATRIC: The patient is alert and oriented x 3.  Anxious SKIN: No obvious rash, lesion, or ulcer.   LABORATORY PANEL:   CBC Recent Labs  Lab 06/28/18 0803  WBC 10.5  HGB 16.2  HCT 49.2  PLT 135*   ------------------------------------------------------------------------------------------------------------------  Chemistries  Recent Labs  Lab 06/28/18 0803  NA 135  K 5.6*  CL 101  CO2 <7*  GLUCOSE 96  BUN 13  CREATININE 1.24  CALCIUM 8.5*  AST 193*  ALT 61*  ALKPHOS 64  BILITOT 1.7*   ------------------------------------------------------------------------------------------------------------------  Cardiac Enzymes Recent Labs  Lab 06/28/18 0803  TROPONINI <0.03   ------------------------------------------------------------------------------------------------------------------  RADIOLOGY:  Dg Chest Portable 1 View  Result Date: 06/28/2018 CLINICAL DATA:  Shortness of breath. EXAM: PORTABLE CHEST 1 VIEW COMPARISON:  Radiographs of April 06, 2017. FINDINGS: The heart size and mediastinal contours are within normal limits. Both lungs are clear. No pneumothorax or pleural effusion is noted. The visualized skeletal structures are unremarkable. IMPRESSION: No active disease.  Electronically Signed   By: Lupita Raider, M.D.   On: 06/28/2018 08:44     IMPRESSION AND PLAN:   *Alcohol withdrawal, severe We will put him on scheduled Librium and Ativan PRN. Start thiamine and folate.  Check LFTs and INR. Abdominal pain likely due to gastritis from alcohol.  Lipase close to normal.  *Metabolic acidosis.  Likely has alcoholic ketosis.  We will also check lactic acid.  Should improve with IV fluids.  Repeat BMP later at 2 PM.  *Sinus tachycardia secondary to dehydration and withdrawals.  Will start metoprolol due to increased blood pressure.  Labetalol IV PRN.  IV fluids started.  2 L bolus stat.  *Hyperkalemia with acute kidney injury. Start IV fluids.  Monitor input and output.  Repeat labs in the morning.  *Mild thrombocytopenia likely due to alcohol.  No bleeding.  *DVT prophylaxis with Lovenox.  Will have to stop if platelets count drops  All the records are reviewed and case discussed with ED provider. Management plans discussed with the patient, family and they are in agreement.  CODE STATUS: Full code  TOTAL TIME TAKING CARE OF THIS PATIENT: 50 minutes.   Orie Fisherman M.D on 06/28/2018 at 9:56 AM  Between 7am to 6pm - Pager - [phone]  After 6pm go to [email or web address] - password  [email or web address]  [facility] [place] [facility]  Office  [phone]  CC: Primary care physician; Jeannie Done, MD  Note: This dictation was prepared with Dragon dictation along with smaller phrase technology. Any transcriptional errors that result from this process are unintentional.

## 2018-06-29 ENCOUNTER — Encounter: Payer: Self-pay | Admitting: Radiology

## 2018-06-29 ENCOUNTER — Inpatient Hospital Stay: Payer: Self-pay

## 2018-06-29 LAB — COMPREHENSIVE METABOLIC PANEL
ALT: 33 U/L (ref 0–44)
ANION GAP: 15 (ref 5–15)
AST: 79 U/L — ABNORMAL HIGH (ref 15–41)
Albumin: 4.4 g/dL (ref 3.5–5.0)
Alkaline Phosphatase: 43 U/L (ref 38–126)
BUN: 16 mg/dL (ref 6–20)
CO2: 15 mmol/L — ABNORMAL LOW (ref 22–32)
Calcium: 8.2 mg/dL — ABNORMAL LOW (ref 8.9–10.3)
Chloride: 104 mmol/L (ref 98–111)
Creatinine, Ser: 1.12 mg/dL (ref 0.61–1.24)
GFR calc non Af Amer: >60 mL/min (ref >60)
Glucose, Bld: 132 mg/dL — ABNORMAL HIGH (ref 70–99)
POTASSIUM: 3.5 mmol/L (ref 3.5–5.1)
Sodium: 134 mmol/L — ABNORMAL LOW (ref 135–145)
Total Bilirubin: 1.9 mg/dL — ABNORMAL HIGH (ref 0.3–1.2)
Total Protein: 7.3 g/dL (ref 6.5–8.1)

## 2018-06-29 LAB — CBC
HEMATOCRIT: 37.1 % — AB (ref 39.0–52.0)
Hemoglobin: 13 g/dL (ref 13.0–17.0)
MCH: 35.1 pg — ABNORMAL HIGH (ref 26.0–34.0)
MCHC: 35 g/dL (ref 30.0–36.0)
MCV: 100.3 fL — ABNORMAL HIGH (ref 80.0–100.0)
Platelets: 68 10*3/uL — ABNORMAL LOW (ref 150–400)
RBC: 3.7 MIL/uL — ABNORMAL LOW (ref 4.22–5.81)
RDW: 14.5 % (ref 11.5–15.5)
WBC: 5.1 10*3/uL (ref 4.0–10.5)
nRBC: 0 % (ref 0.0–0.2)

## 2018-06-29 LAB — PROTIME-INR
INR: 0.9 (ref 0.8–1.2)
Prothrombin Time: 12 seconds (ref 11.4–15.2)

## 2018-06-29 LAB — HIV ANTIBODY (ROUTINE TESTING W REFLEX): HIV SCREEN 4TH GENERATION: NONREACTIVE

## 2018-06-29 MED ORDER — SODIUM BICARBONATE 8.4 % IV SOLN
INTRAVENOUS | Status: AC
  Administered 2018-06-29 (×2): via INTRAVENOUS
  Filled 2018-06-29 (×2): qty 150

## 2018-06-29 MED ORDER — IOPAMIDOL (ISOVUE-300) INJECTION 61%
15.0000 mL | INTRAVENOUS | Status: AC
  Administered 2018-06-29: 15 mL via ORAL

## 2018-06-29 MED ORDER — IOHEXOL 300 MG/ML  SOLN
100.0000 mL | Freq: Once | INTRAMUSCULAR | Status: AC | PRN
  Administered 2018-06-29: 100 mL via INTRAVENOUS

## 2018-06-29 NOTE — Progress Notes (Signed)
Patient's face is red, it wasn't yesterday.  It's slightly warm to the touch.  Patient says it doesn't feel any different.  He's not received any Vancomycin.  Will monitor.

## 2018-06-29 NOTE — Progress Notes (Signed)
[facility] - [place] at [facility]   PATIENT NAME: Paul Miles    MR#:  [id number]  DATE OF BIRTH:  Feb 06, 1981  SUBJECTIVE:  CHIEF COMPLAINT:   Chief Complaint  Patient presents with  . Emesis  . Delirium Tremens (DTS)   Continues to have tremors. UP and ambulating but unsteady.  Has upper abd pain. Lipase 122  REVIEW OF SYSTEMS:    Review of Systems  Constitutional: Positive for malaise/fatigue. Negative for chills and fever.  HENT: Negative for sore throat.   Eyes: Negative for blurred vision, double vision and pain.  Respiratory: Negative for cough, hemoptysis, shortness of breath and wheezing.   Cardiovascular: Negative for chest pain, palpitations, orthopnea and leg swelling.  Gastrointestinal: Positive for abdominal pain and nausea. Negative for constipation, diarrhea, heartburn and vomiting.  Genitourinary: Negative for dysuria and hematuria.  Musculoskeletal: Negative for back pain and joint pain.  Skin: Negative for rash.  Neurological: Positive for tremors. Negative for sensory change, speech change, focal weakness and headaches.  Endo/Heme/Allergies: Does not bruise/bleed easily.  Psychiatric/Behavioral: Negative for depression. The patient is not nervous/anxious.    DRUG ALLERGIES:  No Known Allergies  VITALS:  Blood pressure 109/74, pulse 92, temperature 98 F (36.7 C), temperature source Oral, resp. rate 20, height 6' (1.829 m), weight 77.9 kg, SpO2 99 %.  PHYSICAL EXAMINATION:   Physical Exam  GENERAL:  38 y.o.-year-old patient lying in the bed with tremors EYES: Pupils equal, round, reactive to light and accommodation. No scleral icterus. Extraocular muscles intact.  HEENT: Head atraumatic, normocephalic. Oropharynx and nasopharynx clear.  NECK:  Supple, no jugular venous distention. No thyroid enlargement, no tenderness.  LUNGS: Normal breath sounds bilaterally, no wheezing, rales, rhonchi. No use of accessory muscles of  respiration.  CARDIOVASCULAR: S1, S2 normal. No murmurs, rubs, or gallops.  ABDOMEN: Soft, nontender, nondistended. Bowel sounds present. No organomegaly or mass.  EXTREMITIES: No cyanosis, clubbing or edema b/l. NEUROLOGIC: Cranial nerves II through XII are intact. No focal Motor or sensory deficits b/l.   PSYCHIATRIC: The patient is alert and oriented x 3.  SKIN: No obvious rash, lesion, or ulcer.   LABORATORY PANEL:   CBC Recent Labs  Lab 06/29/18 0619  WBC 5.1  HGB 13.0  HCT 37.1*  PLT 68*   ------------------------------------------------------------------------------------------------------------------ Chemistries  Recent Labs  Lab 06/28/18 1135  06/29/18 0619  NA  --    < > 134*  K  --    < > 3.5  CL  --    < > 104  CO2  --    < > 15*  GLUCOSE  --    < > 132*  BUN  --    < > 16  CREATININE  --    < > 1.12  CALCIUM  --    < > 8.2*  MG 1.9  --   --   AST  --   --  79*  ALT  --   --  33  ALKPHOS  --   --  43  BILITOT  --   --  1.9*   < > = values in this interval not displayed.   ------------------------------------------------------------------------------------------------------------------  Cardiac Enzymes Recent Labs  Lab 06/28/18 0803  TROPONINI <0.03   ------------------------------------------------------------------------------------------------------------------  RADIOLOGY:  Dg Chest Portable 1 View  Result Date: 06/28/2018 CLINICAL DATA:  Shortness of breath. EXAM: PORTABLE CHEST 1 VIEW COMPARISON:  Radiographs of April 06, 2017. FINDINGS: The heart size and mediastinal contours are within  normal limits. Both lungs are clear. No pneumothorax or pleural effusion is noted. The visualized skeletal structures are unremarkable. IMPRESSION: No active disease. Electronically Signed   By: Lupita Raider, M.D.   On: 06/28/2018 08:44     ASSESSMENT AND PLAN:   *Alcohol withdrawal scheduled Librium and Ativan PRN. thiamine and folate.  Normal  INR Abdominal pain likely due to gastritis from alcohol likely.  Lipase close to normal. Will order CT abdomen  * Hyperkalemia Resolved  *Metabolic acidosis.  Due to lactic acidosis and alcoholic ketosis. Improving. Now also on biacrb drip  *Sinus tachycardia secondary to dehydration and withdrawals.  Started metoprolol  * AKI due to dehydration Resolved  *Mild thrombocytopenia likely due to alcohol.  No bleeding. Platelets have dropped from 135 to 65K. Likely dilutional. Will stop lovenox  *DVT prophylaxis SCDs  All the records are reviewed and case discussed with Care Management/Social Worker Management plans discussed with the patient, family and they are in agreement.  CODE STATUS: FULL CODE  DVT Prophylaxis: SCDs  TOTAL TIME TAKING CARE OF THIS PATIENT: 35 minutes.   POSSIBLE D/C IN 1-2  DAYS, DEPENDING ON CLINICAL CONDITION.  Orie Fisherman M.D on 06/29/2018 at 11:49 AM  Between 7am to 6pm - Pager - [phone]  After 6pm go to [email or web address] - password [email or web address]  [facility] [place] [facility]  Office  [phone]  CC: Primary care physician; Jeannie Done, MD  Note: This dictation was prepared with Dragon dictation along with smaller phrase technology. Any transcriptional errors that result from this process are unintentional.

## 2018-06-30 LAB — CBC WITH DIFFERENTIAL/PLATELET
Abs Immature Granulocytes: 0.02 10*3/uL (ref 0.00–0.07)
Basophils Absolute: 0 10*3/uL (ref 0.0–0.1)
Basophils Relative: 1 %
Eosinophils Absolute: 0.1 10*3/uL (ref 0.0–0.5)
Eosinophils Relative: 2 %
HCT: 33.5 % — ABNORMAL LOW (ref 39.0–52.0)
Hemoglobin: 12 g/dL — ABNORMAL LOW (ref 13.0–17.0)
Immature Granulocytes: 1 %
Lymphocytes Relative: 32 %
Lymphs Abs: 1.2 10*3/uL (ref 0.7–4.0)
MCH: 35.2 pg — ABNORMAL HIGH (ref 26.0–34.0)
MCHC: 35.8 g/dL (ref 30.0–36.0)
MCV: 98.2 fL (ref 80.0–100.0)
Monocytes Absolute: 0.2 10*3/uL (ref 0.1–1.0)
Monocytes Relative: 6 %
NEUTROS PCT: 58 %
Neutro Abs: 2.3 10*3/uL (ref 1.7–7.7)
Platelets: 51 10*3/uL — ABNORMAL LOW (ref 150–400)
RBC: 3.41 MIL/uL — AB (ref 4.22–5.81)
RDW: 14.3 % (ref 11.5–15.5)
WBC: 3.8 10*3/uL — ABNORMAL LOW (ref 4.0–10.5)
nRBC: 0 % (ref 0.0–0.2)

## 2018-06-30 LAB — COMPREHENSIVE METABOLIC PANEL
ALT: 32 U/L (ref 0–44)
AST: 82 U/L — ABNORMAL HIGH (ref 15–41)
Albumin: 4.2 g/dL (ref 3.5–5.0)
Alkaline Phosphatase: 36 U/L — ABNORMAL LOW (ref 38–126)
Anion gap: 13 (ref 5–15)
BUN: 12 mg/dL (ref 6–20)
CO2: 29 mmol/L (ref 22–32)
Calcium: 8.7 mg/dL — ABNORMAL LOW (ref 8.9–10.3)
Chloride: 96 mmol/L — ABNORMAL LOW (ref 98–111)
Creatinine, Ser: 0.63 mg/dL (ref 0.61–1.24)
GFR calc Af Amer: >60 mL/min (ref >60)
Glucose, Bld: 101 mg/dL — ABNORMAL HIGH (ref 70–99)
Potassium: 2.9 mmol/L — ABNORMAL LOW (ref 3.5–5.1)
Sodium: 138 mmol/L (ref 135–145)
Total Bilirubin: 1.7 mg/dL — ABNORMAL HIGH (ref 0.3–1.2)
Total Protein: 6.8 g/dL (ref 6.5–8.1)

## 2018-06-30 MED ORDER — LORAZEPAM 2 MG/ML IJ SOLN: 0.0000 mg | Freq: Four times a day (QID) | INTRAMUSCULAR | Status: DC

## 2018-06-30 MED ORDER — POTASSIUM CHLORIDE CRYS ER 20 MEQ PO TBCR
40.0000 meq | EXTENDED_RELEASE_TABLET | Freq: Three times a day (TID) | ORAL | Status: DC
  Administered 2018-06-30: 40 meq via ORAL
  Filled 2018-06-30: qty 2

## 2018-06-30 MED ORDER — LORAZEPAM 1 MG PO TABS: 1.0000 mg | ORAL_TABLET | Freq: Four times a day (QID) | ORAL | Status: DC | PRN

## 2018-06-30 MED ORDER — ALBUTEROL SULFATE HFA 108 (90 BASE) MCG/ACT IN AERS
2.0000 | INHALATION_SPRAY | Freq: Four times a day (QID) | RESPIRATORY_TRACT | Status: DC | PRN
  Filled 2018-06-30: qty 6.7

## 2018-06-30 MED ORDER — POTASSIUM CHLORIDE CRYS ER 20 MEQ PO TBCR: 40.0000 meq | EXTENDED_RELEASE_TABLET | Freq: Every day | ORAL | 0 refills | Status: AC

## 2018-06-30 MED ORDER — THIAMINE HCL 100 MG PO TABS: 100.0000 mg | ORAL_TABLET | Freq: Every day | ORAL | 0 refills | Status: AC

## 2018-06-30 MED ORDER — LORAZEPAM 2 MG PO TABS: 0.0000 mg | ORAL_TABLET | Freq: Four times a day (QID) | ORAL | Status: DC

## 2018-06-30 MED ORDER — LORAZEPAM 0.5 MG PO TABS: ORAL_TABLET | ORAL | 0 refills | Status: AC

## 2018-06-30 MED ORDER — METOPROLOL TARTRATE 25 MG PO TABS: 50.0000 mg | ORAL_TABLET | Freq: Two times a day (BID) | ORAL | 0 refills | Status: AC

## 2018-06-30 NOTE — Discharge Summary (Signed)
[facility] - [place] at [facility]   PATIENT NAME: Paul Miles    MR#:  [id number]  DATE OF BIRTH:  1981/04/19  DATE OF ADMISSION:  06/28/2018 ADMITTING PHYSICIAN: Milagros LollSrikar Sudini, MD  DATE OF Signing out against Medical advise: 06/30/2018  3:32 PM  PRIMARY CARE PHYSICIAN: Jeannie DonePolanco, Leonard F, MD    ADMISSION DIAGNOSIS:  Alcohol withdrawal syndrome with complication (HCC) [F10.239]  DISCHARGE DIAGNOSIS:  Active Problems:   Alcohol withdrawal (HCC)   SECONDARY DIAGNOSIS:   Past Medical History:  Diagnosis Date  . Alcohol abuse   . Asthma   . Gout   . IBS (irritable bowel syndrome)   . Tremor     HOSPITAL COURSE:   1.  Alcohol withdrawal.  Patient continues to have withdrawal symptoms.  Patient states that he has a baseline tremor in knees says he is stable to go home.  I was hesitant about discharging him home.  Patient signed out AGAINST MEDICAL ADVICE.  Did prescribe a few pills of Ativan and thiamine to go home with. 2.  Hypokalemia.  Few pills of potassium prescribed upon discharge 3.  Metabolic acidosis and lactic acidosis secondary to alcohol abuse 4.  Sinus tachycardia and hypertension on metoprolol 5.  Acute kidney injury and hyperkalemia on presentation.  This has improved with IV fluids 6.  Thrombocytopenia secondary to alcohol abuse 7.  Fatty infiltration of the liver seen on CT scan  DISCHARGE CONDITIONS:  Failure since he signed out AMA  CONSULTS OBTAINED:  None  DRUG ALLERGIES:  No Known Allergies  DISCHARGE MEDICATIONS:   Allergies as of 06/30/2018   No Known Allergies     Medication List    STOP taking these medications   chlordiazePOXIDE 25 MG capsule Commonly known as:  LIBRIUM   ondansetron 4 MG disintegrating tablet Commonly known as:  Zofran ODT     TAKE these medications   albuterol 108 (90 Base) MCG/ACT inhaler Commonly known as:  PROVENTIL HFA;VENTOLIN HFA Inhale 2 puffs into the lungs every 6 (six) hours as  needed for wheezing or shortness of breath.   LORazepam 0.5 MG tablet Commonly known as:  ATIVAN One tab po three times a day for one day, one tab po twice a day for two days, one tab po daily for three days   metoprolol tartrate 25 MG tablet Commonly known as:  LOPRESSOR Take 2 tablets (50 mg total) by mouth 2 (two) times daily.   multivitamin Tabs tablet Take 1 tablet by mouth daily.   pantoprazole 40 MG tablet Commonly known as:  Protonix Take 1 tablet (40 mg total) by mouth daily.   potassium chloride SA 20 MEQ tablet Commonly known as:  K-DUR,KLOR-CON Take 2 tablets (40 mEq total) by mouth daily.   thiamine 100 MG tablet Take 1 tablet (100 mg total) by mouth daily. Start taking on:  July 01, 2018        DISCHARGE INSTRUCTIONS:   Follow-up with PMD 5 days  If you experience worsening of your admission symptoms, develop shortness of breath, life threatening emergency, suicidal or homicidal thoughts you must seek medical attention immediately by calling 911 or calling your MD immediately  if symptoms less severe.  You Must read complete instructions/literature along with all the possible adverse reactions/side effects for all the Medicines you take and that have been prescribed to you. Take any new Medicines after you have completely understood and accept all the possible adverse reactions/side effects.   Please note  You were cared for by a hospitalist during your hospital stay. If you have any questions about your discharge medications or the care you received while you were in the hospital after you are discharged, you can call the unit and asked to speak with the hospitalist on call if the hospitalist that took care of you is not available. Once you are discharged, your primary care physician will handle any further medical issues. Please note that NO REFILLS for any discharge medications will be authorized once you are discharged, as it is imperative that you return to  your primary care physician (or establish a relationship with a primary care physician if you do not have one) for your aftercare needs so that they can reassess your need for medications and monitor your lab values.    Today   CHIEF COMPLAINT:   Chief Complaint  Patient presents with  . Emesis  . Delirium Tremens (DTS)    HISTORY OF PRESENT ILLNESS:  Paul Miles  is a 38 y.o. male came in with alcohol withdrawal   VITAL SIGNS:  Blood pressure 131/86, pulse 85, temperature 98 F (36.7 C), temperature source Oral, resp. rate 16, height 6' (1.829 m), weight 77.7 kg, SpO2 98 %.   PHYSICAL EXAMINATION:  GENERAL:  38 y.o.-year-old patient lying in the bed with no acute distress.  EYES: Pupils equal, round, reactive to light and accommodation. No scleral icterus. Extraocular muscles intact.  HEENT: Head atraumatic, normocephalic. Oropharynx and nasopharynx clear.  NECK:  Supple, no jugular venous distention. No thyroid enlargement, no tenderness.  LUNGS: Normal breath sounds bilaterally, no wheezing, rales,rhonchi or crepitation. No use of accessory muscles of respiration.  CARDIOVASCULAR: S1, S2 normal. No murmurs, rubs, or gallops.  ABDOMEN: Soft, non-tender, non-distended. Bowel sounds present. No organomegaly or mass.  EXTREMITIES: No pedal edema, cyanosis, or clubbing.  NEUROLOGIC: Cranial nerves II through XII are intact. Muscle strength 5/5 in all extremities. Sensation intact. Gait not checked.  Positive upper extremity tremors PSYCHIATRIC: The patient is alert and answers questions appropriately.  SKIN: No obvious rash, lesion, or ulcer.   DATA REVIEW:   CBC Recent Labs  Lab 06/30/18 0532  WBC 3.8*  HGB 12.0*  HCT 33.5*  PLT 51*    Chemistries  Recent Labs  Lab 06/28/18 1135  06/30/18 0532  NA  --    < > 138  K  --    < > 2.9*  CL  --    < > 96*  CO2  --    < > 29  GLUCOSE  --    < > 101*  BUN  --    < > 12  CREATININE  --    < > 0.63  CALCIUM  --    <  > 8.7*  MG 1.9  --   --   AST  --    < > 82*  ALT  --    < > 32  ALKPHOS  --    < > 36*  BILITOT  --    < > 1.7*   < > = values in this interval not displayed.    Cardiac Enzymes Recent Labs  Lab 06/28/18 0803  TROPONINI <0.03    Microbiology Results  Results for orders placed or performed during the hospital encounter of 04/06/17  CULTURE, BLOOD (ROUTINE X 2) w Reflex to ID Panel     Status: None   Collection Time: 04/06/17  7:39 PM  Result Value Ref Range  Status   Specimen Description BLOOD RAC  Final   Special Requests   Final    BOTTLES DRAWN AEROBIC AND ANAEROBIC Blood Culture adequate volume   Culture   Final    NO GROWTH 5 DAYS Performed at [facility], [address] [place], [place] [address]    Report Status 04/11/2017 FINAL  Final  CULTURE, BLOOD (ROUTINE X 2) w Reflex to ID Panel     Status: None   Collection Time: 04/06/17  7:46 PM  Result Value Ref Range Status   Specimen Description BLOOD LEFT HAND  Final   Special Requests   Final    BOTTLES DRAWN AEROBIC AND ANAEROBIC Blood Culture adequate volume   Culture   Final    NO GROWTH 5 DAYS Performed at [facility], [address] [place], [place] [address]    Report Status 04/11/2017 FINAL  Final    RADIOLOGY:  Ct Abdomen Pelvis W Contrast  Result Date: 06/29/2018 CLINICAL DATA:  Abdominal pain and history of alcohol use EXAM: CT ABDOMEN AND PELVIS WITH CONTRAST TECHNIQUE: Multidetector CT imaging of the abdomen and pelvis was performed using the standard protocol following bolus administration of intravenous contrast. CONTRAST:  OMNIPAQUE IOHEXOL 300 MG/ML  SOLN COMPARISON:  06/01/2017 FINDINGS: Lower chest: No acute abnormality. Hepatobiliary: Liver is diffusely fatty infiltrated. The gallbladder is within normal limits. Pancreas: Unremarkable. No pancreatic ductal dilatation or surrounding inflammatory changes. Spleen: Normal in size without focal abnormality. Adrenals/Urinary  Tract: Adrenal glands are within normal limits. The kidneys demonstrate no renal calculi or obstructive changes. The bladder is well distended. Stomach/Bowel: Scattered mild diverticular change of the colon is noted without evidence of diverticulitis. The appendix is within normal limits. Some small bowel diverticula are noted as well. No obstructive or inflammatory changes are seen. Stomach is within normal limits. Vascular/Lymphatic: No significant vascular findings are present. No enlarged abdominal or pelvic lymph nodes. Reproductive: Prostate is unremarkable. Other: No abdominal wall hernia or abnormality. No abdominopelvic ascites. Musculoskeletal: No acute or significant osseous findings. IMPRESSION: Diverticulosis without diverticulitis. Fatty infiltration of the liver. Normal-appearing pancreas. No acute abnormality noted. Electronically Signed   By: Alcide Clever M.D.   On: 06/29/2018 14:50    Management plans discussed with the patient, and then he signed out AGAINST MEDICAL ADVICE.  CODE STATUS:     Code Status Orders  (From admission, onward)         Start     Ordered   06/28/18 0953  Full code  Continuous     06/28/18 0953        Code Status History    Date Active Date Inactive Code Status Order ID Comments User Context   04/06/2017 1827 04/08/2017 1437 Full Code [id number]  Enid Baas, MD Inpatient      TOTAL TIME TAKING CARE OF THIS PATIENT: 32 minutes.    Alford Highland M.D on 06/30/2018 at 4:21 PM  Between 7am to 6pm - Pager - [phone]  After 6pm go to [email or web address] - password [email or web address]  [facility] Office  [phone]  CC: Primary care physician; Jeannie Done, MD

## 2018-06-30 NOTE — Progress Notes (Signed)
Patient is leaving AMA.  I read the Against Medical Advice form to him and explained to him that some insurance companies to not pay for the admission if you sign out AMA.  He said "OK" to both of these and signed the form. He continues to have withdrawal symptoms, his last CIWA evaluation was 6.  He has determined he is fine and doesn't need to be here.  His wife said similar things when she was here this morning.

## 2018-06-30 NOTE — Progress Notes (Signed)
I provided him with a copy of the signed AMA form

## 2018-06-30 NOTE — Progress Notes (Signed)
Pt is more anxious and irritable on this shift, states that he will go home, and refuses bed alarm. Pt has an albuterol inhaler in room, this writer educated pt about medication storage protocol, pt is not cooperating and states that the inhaler is his and he will use it when he wants to use it (inhaler was sent to pharmacy for verification only). Pt's wife stated that pt wants to "go home" and that pt is at an "irritable stage that he will tear-off any tubes and IV's" Pt wife is expessing concerns with pt's behavior and states that he needs all the help he can get including referrals to counseling. Will continue to monitor.

## 2018-06-30 NOTE — Progress Notes (Signed)
He has removed his telemetry box.  Plans to go home whether we discharge him or not. His ride is on the way.  Dr. Renae Gloss notified

## 2018-06-30 NOTE — Discharge Instructions (Signed)
Alcohol Withdrawal Syndrome °When a person who drinks a lot of alcohol stops drinking, he or she may have unpleasant and serious symptoms. These symptoms are called alcohol withdrawal syndrome. This condition may be mild or severe. It can be life-threatening. It can cause: °· Shaking that you cannot control (tremor). °· Sweating. °· Headache. °· Feeling fearful, upset, grouchy, or depressed. °· Trouble sleeping (insomnia). °· Nightmares. °· Fast or uneven heartbeats (palpitations). °· Alcohol cravings. °· Feeling sick to your stomach (nausea). °· Throwing up (vomiting). °· Being bothered by light and sounds. °· Confusion. °· Trouble thinking clearly. °· Not being hungry (loss of appetite). °· Big changes in mood (mood swings). °If you have all of the following symptoms at the same time, get help right away: °· High blood pressure. °· Fast heartbeat. °· Trouble breathing. °· Seizures. °· Seeing, hearing, feeling, smelling, or tasting things that are not there (hallucinations). °These symptoms are known as delirium tremens (DTs). They must be treated at the hospital right away. °Follow these instructions at home: ° °· Take over-the-counter and prescription medicines only as told by your doctor. This includes vitamins. °· Do not drink alcohol. °· Do not drive until your doctor says that this is safe for you. °· Have someone stay with you or be available in case you need help. This should be someone you trust. This person can help you with your symptoms. He or she can also help you to not drink. °· Drink enough fluid to keep your pee (urine) pale yellow. °· Think about joining a support group or a treatment program to help you stop drinking. °· Keep all follow-up visits as told by your doctor. This is important. °Contact a doctor if: °· Your symptoms get worse. °· You cannot eat or drink without throwing up. °· You have a hard time not drinking alcohol. °· You cannot stop drinking alcohol. °Get help right away  if: °· You have fast or uneven heartbeats. °· You have chest pain. °· You have trouble breathing. °· You have a seizure for the first time. °· You see, hear, feel, smell, or taste something that is not there. °· You get very confused. °Summary °· When a person who drinks a lot of alcohol stops drinking, he or she may have serious symptoms. This is called alcohol withdrawal syndrome. °· Delirium tremens (DTs) is a group of life-threatening symptoms. You should get help right away if you have these symptoms. °· Think about joining an alcohol support group or a treatment program. °This information is not intended to replace advice given to you by your health care provider. Make sure you discuss any questions you have with your health care provider. °Document Released: 09/22/2007 Document Revised: 12/10/2016 Document Reviewed: 12/10/2016 °[employer] Interactive Patient Education © 2019 [employer] ° °

## 2020-01-10 ENCOUNTER — Emergency Department: Payer: Self-pay

## 2020-01-10 ENCOUNTER — Emergency Department
Admission: EM | Admit: 2020-01-10 | Discharge: 2020-01-10 | Disposition: A | Discharge status: Home / Self Care | Payer: Self-pay | Attending: Emergency Medicine | Admitting: Emergency Medicine

## 2020-01-10 ENCOUNTER — Other Ambulatory Visit: Payer: Self-pay

## 2020-01-10 DIAGNOSIS — Z5321 Procedure and treatment not carried out due to patient leaving prior to being seen by health care provider: Secondary | ICD-10-CM | POA: Insufficient documentation

## 2020-01-10 DIAGNOSIS — R197 Diarrhea, unspecified: Secondary | ICD-10-CM | POA: Insufficient documentation

## 2020-01-10 DIAGNOSIS — R112 Nausea with vomiting, unspecified: Secondary | ICD-10-CM | POA: Insufficient documentation

## 2020-01-10 DIAGNOSIS — R0789 Other chest pain: Secondary | ICD-10-CM | POA: Insufficient documentation

## 2020-01-10 LAB — COMPREHENSIVE METABOLIC PANEL
ALT: 54 U/L — ABNORMAL HIGH (ref 0–44)
AST: 262 U/L — ABNORMAL HIGH (ref 15–41)
Albumin: 5 g/dL (ref 3.5–5.0)
Alkaline Phosphatase: 75 U/L (ref 38–126)
Anion gap: 26 — ABNORMAL HIGH (ref 5–15)
BUN: 9 mg/dL (ref 6–20)
CO2: 18 mmol/L — ABNORMAL LOW (ref 22–32)
Calcium: 8.5 mg/dL — ABNORMAL LOW (ref 8.9–10.3)
Chloride: 88 mmol/L — ABNORMAL LOW (ref 98–111)
Creatinine, Ser: 0.78 mg/dL (ref 0.61–1.24)
GFR calc Af Amer: >60 mL/min (ref >60)
GFR calc non Af Amer: >60 mL/min (ref >60)
Glucose, Bld: 115 mg/dL — ABNORMAL HIGH (ref 70–99)
Potassium: 2.7 mmol/L — CL (ref 3.5–5.1)
Sodium: 132 mmol/L — ABNORMAL LOW (ref 135–145)
Total Bilirubin: 3.1 mg/dL — ABNORMAL HIGH (ref 0.3–1.2)
Total Protein: 8.1 g/dL (ref 6.5–8.1)

## 2020-01-10 LAB — CBC
HCT: 36.2 % — ABNORMAL LOW (ref 39.0–52.0)
Hemoglobin: 13.5 g/dL (ref 13.0–17.0)
MCH: 34.4 pg — ABNORMAL HIGH (ref 26.0–34.0)
MCHC: 37.3 g/dL — ABNORMAL HIGH (ref 30.0–36.0)
MCV: 92.1 fL (ref 80.0–100.0)
Platelets: 52 10*3/uL — ABNORMAL LOW (ref 150–400)
RBC: 3.93 MIL/uL — ABNORMAL LOW (ref 4.22–5.81)
RDW: 16.7 % — ABNORMAL HIGH (ref 11.5–15.5)
WBC: 3.1 10*3/uL — ABNORMAL LOW (ref 4.0–10.5)
nRBC: 0 % (ref 0.0–0.2)

## 2020-01-10 LAB — TROPONIN I (HIGH SENSITIVITY)
Troponin I (High Sensitivity): 7 ng/L (ref <18)
Troponin I (High Sensitivity): 6 ng/L (ref <18)

## 2020-01-10 LAB — LIPASE, BLOOD: Lipase: 55 U/L — ABNORMAL HIGH (ref 11–51)

## 2020-01-10 LAB — ETHANOL: Alcohol, Ethyl (B): 78 mg/dL — ABNORMAL HIGH (ref <10)

## 2020-01-10 NOTE — ED Triage Notes (Signed)
Pt in with co chest pain that started today, pt states to left chest. Pain worse with inspiration and movement. Pt states has had n/v/d since yesterday. Pt is a daily drinker states has had 6-7 shots of liquor today.

## 2020-01-10 NOTE — ED Triage Notes (Signed)
Pt called from [facility] to treatment room, no response 

## 2020-01-10 NOTE — ED Triage Notes (Signed)
Pt called for reassessment, no response 

## 2020-01-10 NOTE — ED Notes (Signed)
Pt called x's 3, no response ?

## 2021-02-06 ENCOUNTER — Other Ambulatory Visit
Admission: RE | Admit: 2021-02-06 | Discharge: 2021-02-06 | Disposition: A | Discharge status: Home / Self Care | Payer: BC Managed Care – PPO | Source: Ambulatory Visit | Attending: Internal Medicine | Admitting: Internal Medicine

## 2021-02-06 DIAGNOSIS — Z03818 Encounter for observation for suspected exposure to other biological agents ruled out: Secondary | ICD-10-CM | POA: Insufficient documentation

## 2021-02-06 DIAGNOSIS — R0602 Shortness of breath: Secondary | ICD-10-CM | POA: Insufficient documentation

## 2021-02-06 DIAGNOSIS — R0789 Other chest pain: Secondary | ICD-10-CM | POA: Diagnosis not present

## 2021-02-06 LAB — D-DIMER, QUANTITATIVE: D-Dimer, Quant: 0.28 ug/mL-FEU (ref 0.00–0.50)

## 2021-03-03 IMAGING — DX PORTABLE CHEST - 1 VIEW
1 series · 1 of 1 positions shown · non-contrast
Comparison: Radiographs April 06, 2017.

CLINICAL DATA: Shortness of breath.

EXAM:
PORTABLE CHEST 1 VIEW

[chest ap]
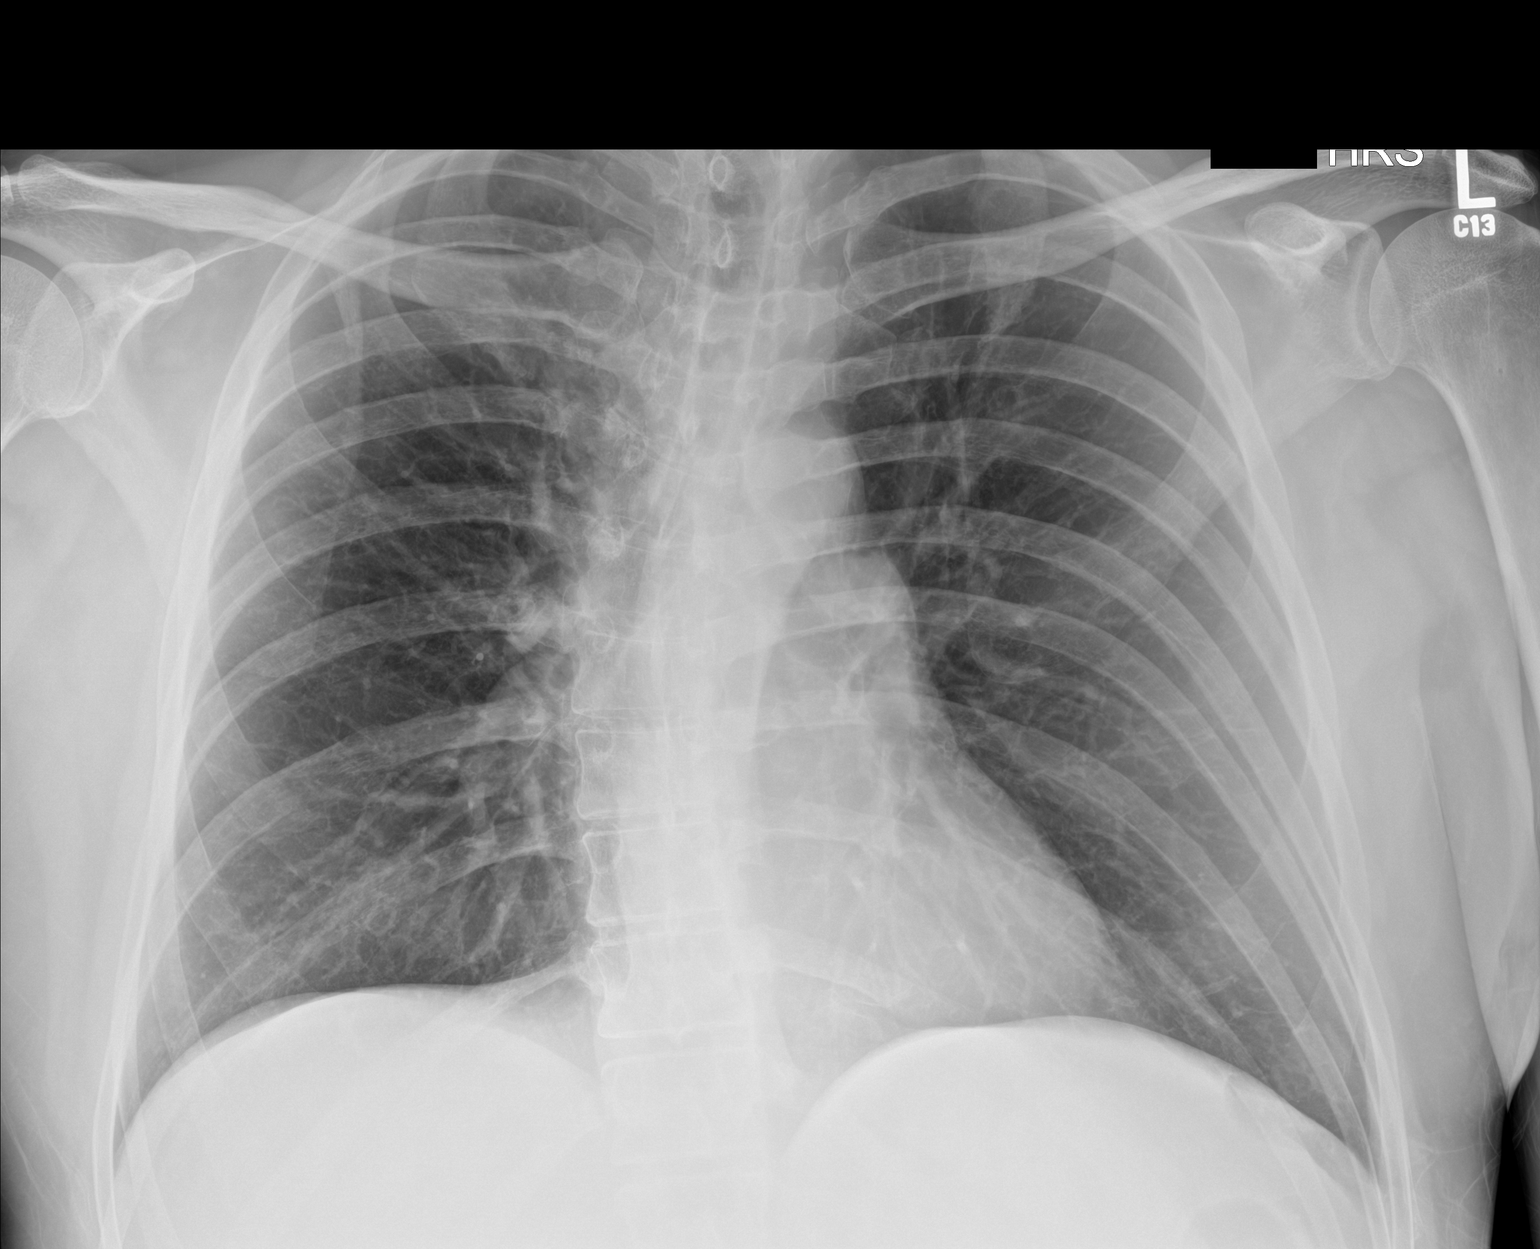

[1 of 1 positions shown; findings below may reference images not displayed]

FINDINGS: The heart size and mediastinal contours are within normal limits.
Both lungs are clear. No pneumothorax or pleural effusion is noted.
The visualized skeletal structures are unremarkable.
IMPRESSION: No active disease.

## 2022-09-15 IMAGING — CR DG CHEST 2V
2 series · 3 of 3 positions shown · non-contrast
Comparison: 06/28/2018

CLINICAL DATA: Chest pain

EXAM:
CHEST - 2 VIEW

[chest lat]
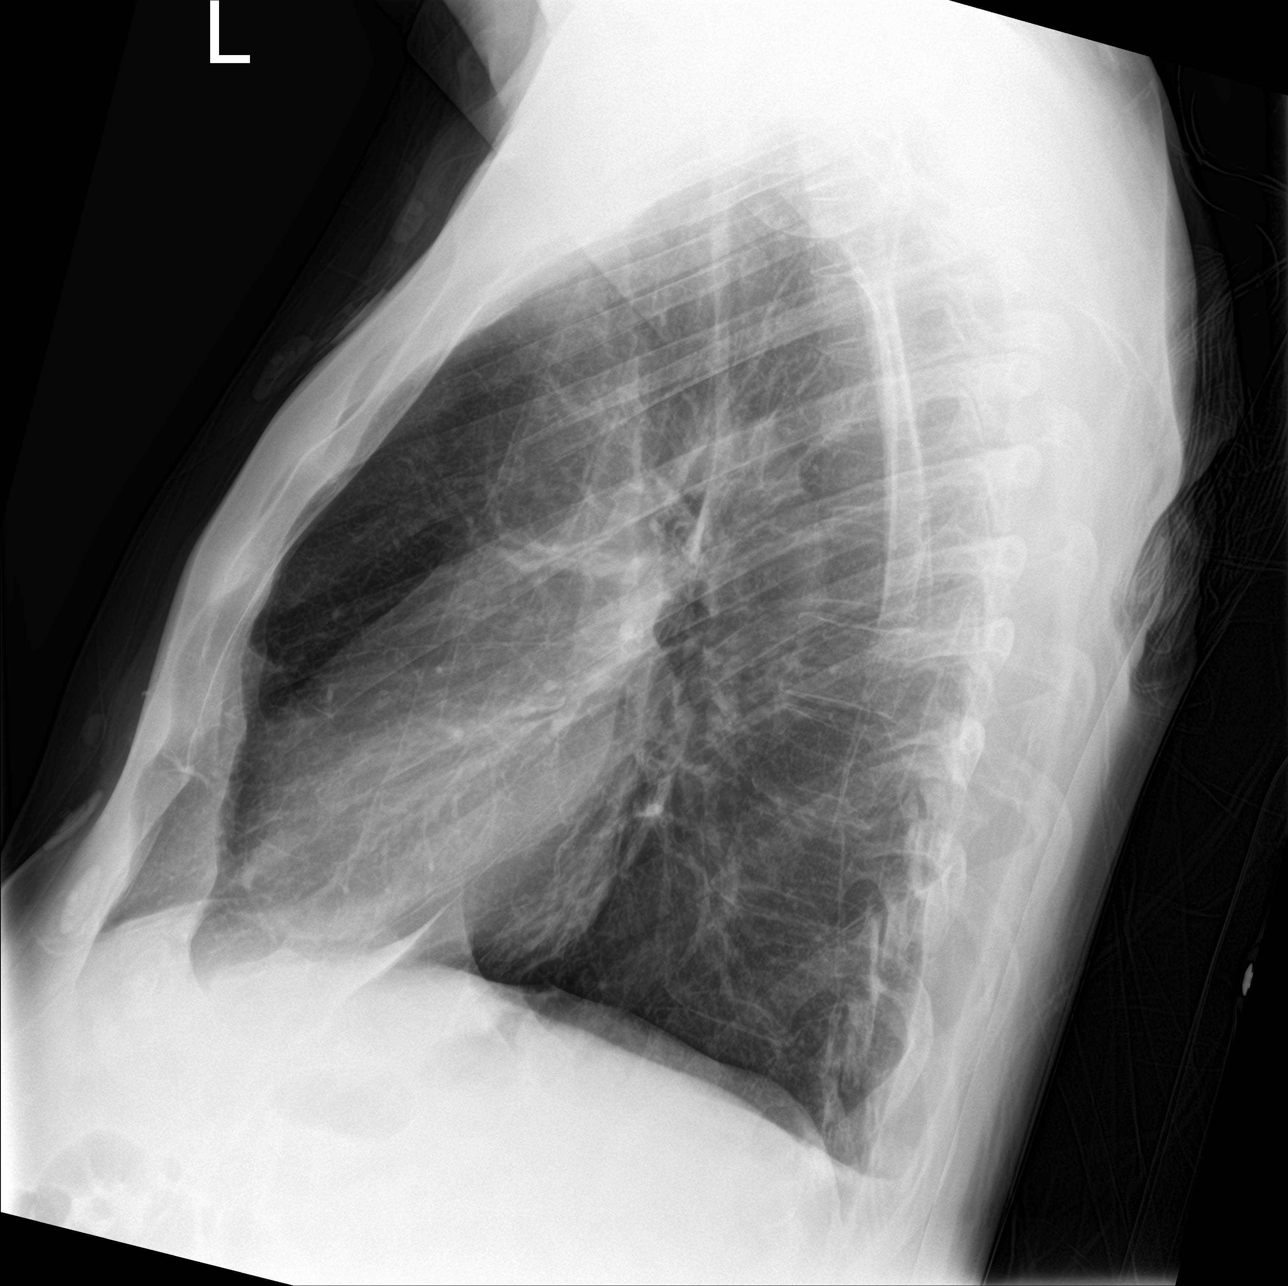

[Series 3: chest ap · 0.14mm/px · 2 of 2 slices shown]
[im 1/2]
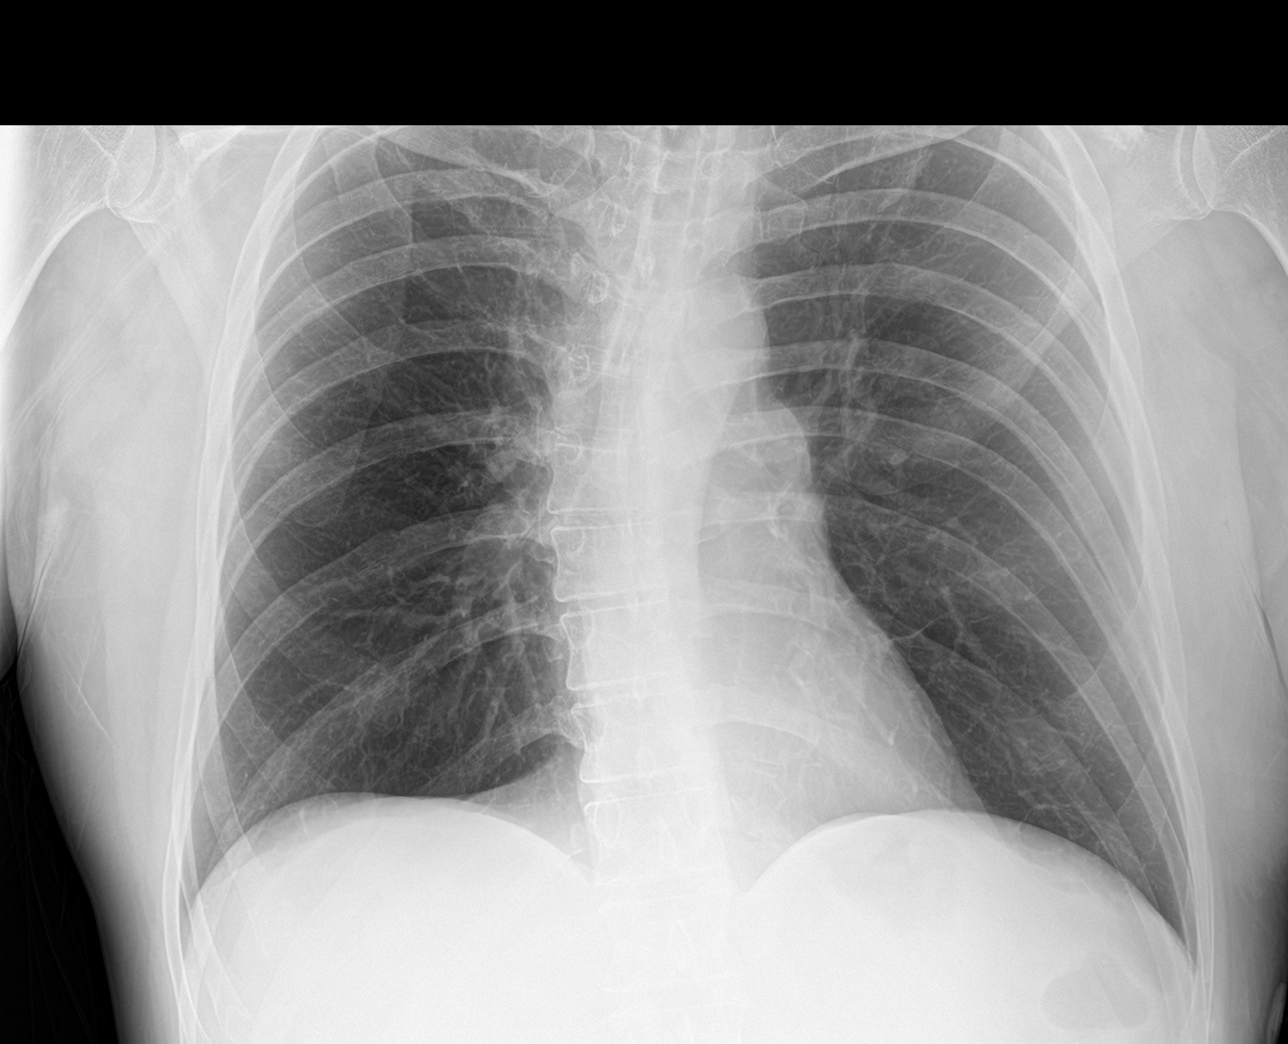
[im 2/2]
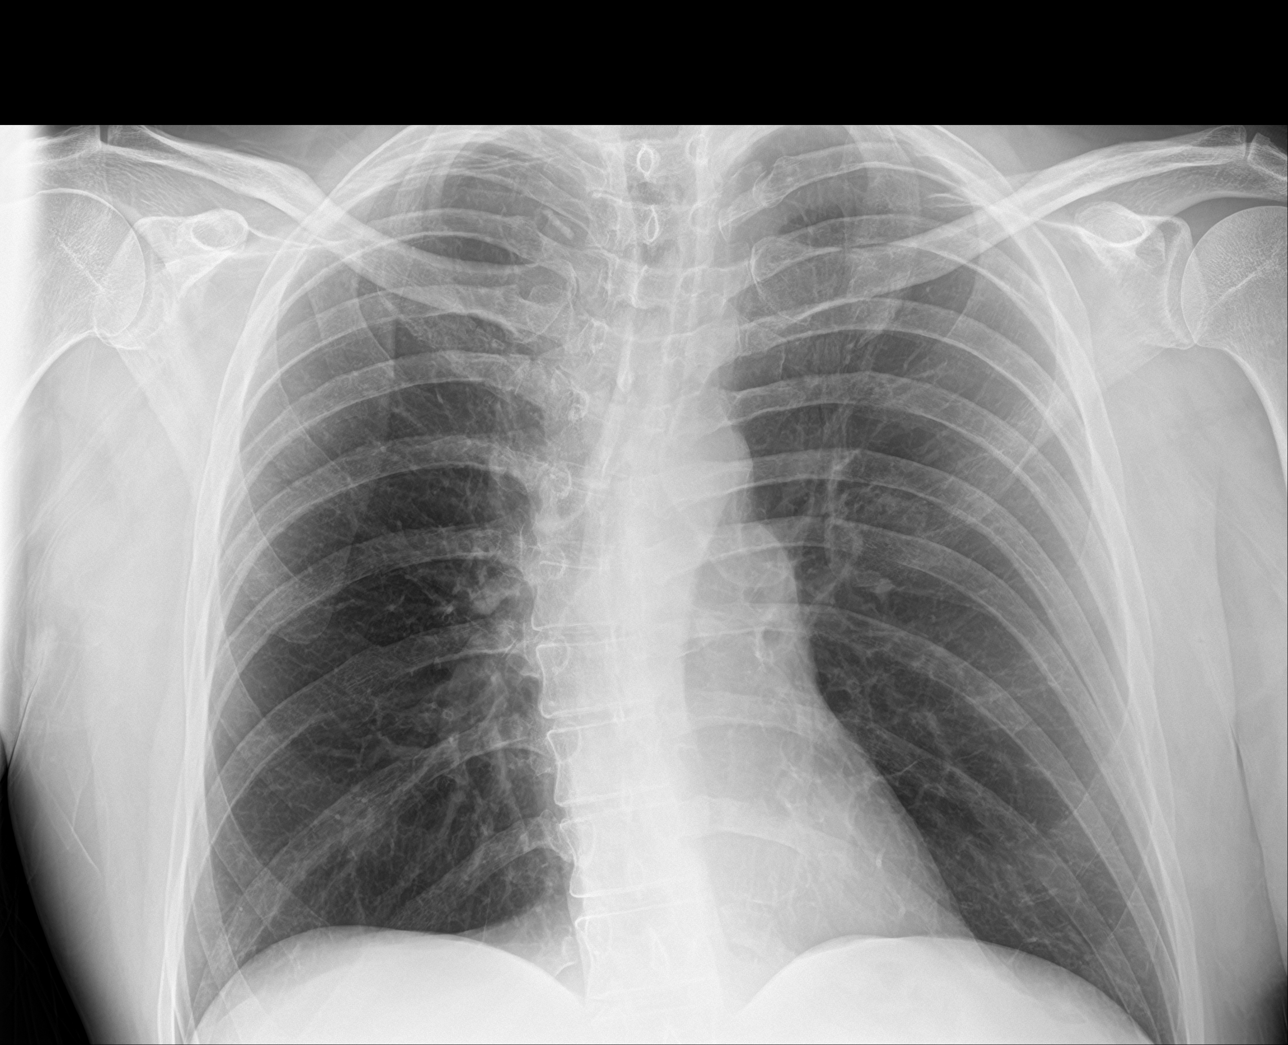

[3 of 3 positions shown; findings below may reference images not displayed]

FINDINGS: Lungs are well expanded, symmetric, and clear. No pneumothorax or
pleural effusion. Cardiac size within normal limits. Pulmonary
vascularity is normal. Osseous structures are age-appropriate. Note
is made of mild to moderate thoracic dextroscoliosis. No acute bone
abnormality.
IMPRESSION: No active cardiopulmonary disease.

## 2023-07-11 ENCOUNTER — Ambulatory Visit: Admission: EM | Admit: 2023-07-11 | Discharge: 2023-07-11 | Disposition: A | Discharge status: Home / Self Care

## 2023-07-11 NOTE — ED Provider Notes (Signed)
 Patient presents for evaluation of a sore throat present for 2 days, endorses a severe headache present for 3 weeks after head injury therefore he has been sent to the nearest emergency department for further evaluation and management as he will need imaging   Valinda Hoar, NP 07/11/23 [id number]

## 2023-07-11 NOTE — ED Notes (Signed)
Provider spoke to patient
